# Patient Record
Sex: Male | Born: 1965 | Race: Asian | Hispanic: No | Marital: Married | Smoking: Never smoker
Health system: Southern US, Community
[De-identification: ages and names within clinical notes are randomized; demographics above are authoritative.]

## PROBLEM LIST (undated history)

## (undated) DIAGNOSIS — K219 Gastro-esophageal reflux disease without esophagitis: Secondary | ICD-10-CM

## (undated) HISTORY — PX: NO PAST SURGERIES: SHX2092

---

## 2003-03-24 ENCOUNTER — Emergency Department (HOSPITAL_COMMUNITY): Admission: EM | Admit: 2003-03-24 | Discharge: 2003-03-24 | Payer: Self-pay | Admitting: *Deleted

## 2016-03-11 ENCOUNTER — Encounter (HOSPITAL_BASED_OUTPATIENT_CLINIC_OR_DEPARTMENT_OTHER): Payer: Self-pay | Admitting: Emergency Medicine

## 2016-03-11 ENCOUNTER — Emergency Department (HOSPITAL_BASED_OUTPATIENT_CLINIC_OR_DEPARTMENT_OTHER)
Admission: EM | Admit: 2016-03-11 | Discharge: 2016-03-11 | Disposition: A | Discharge status: Home / Self Care | Payer: BLUE CROSS/BLUE SHIELD | Attending: Emergency Medicine | Admitting: Emergency Medicine

## 2016-03-11 DIAGNOSIS — I10 Essential (primary) hypertension: Secondary | ICD-10-CM

## 2016-03-11 DIAGNOSIS — K2900 Acute gastritis without bleeding: Secondary | ICD-10-CM | POA: Insufficient documentation

## 2016-03-11 DIAGNOSIS — R1013 Epigastric pain: Secondary | ICD-10-CM | POA: Diagnosis present

## 2016-03-11 HISTORY — DX: Gastro-esophageal reflux disease without esophagitis: K21.9

## 2016-03-11 LAB — CBC WITH DIFFERENTIAL/PLATELET
BASOS ABS: 0 10*3/uL (ref 0.0–0.1)
BASOS PCT: 1 %
EOS ABS: 0.2 10*3/uL (ref 0.0–0.7)
EOS PCT: 2 %
HCT: 39.6 % (ref 39.0–52.0)
HEMOGLOBIN: 14.3 g/dL (ref 13.0–17.0)
LYMPHS ABS: 2.4 10*3/uL (ref 0.7–4.0)
Lymphocytes Relative: 33 %
MCH: 32.8 pg (ref 26.0–34.0)
MCHC: 36.1 g/dL — ABNORMAL HIGH (ref 30.0–36.0)
MCV: 90.8 fL (ref 78.0–100.0)
Monocytes Absolute: 0.5 10*3/uL (ref 0.1–1.0)
Monocytes Relative: 7 %
NEUTROS PCT: 57 %
Neutro Abs: 4.1 10*3/uL (ref 1.7–7.7)
PLATELETS: 174 10*3/uL (ref 150–400)
RBC: 4.36 MIL/uL (ref 4.22–5.81)
RDW: 12.8 % (ref 11.5–15.5)
WBC: 7.2 10*3/uL (ref 4.0–10.5)

## 2016-03-11 LAB — COMPREHENSIVE METABOLIC PANEL
ALBUMIN: 4.3 g/dL (ref 3.5–5.0)
ALK PHOS: 67 U/L (ref 38–126)
ALT: 57 U/L (ref 17–63)
AST: 45 U/L — AB (ref 15–41)
Anion gap: 10 (ref 5–15)
BUN: 12 mg/dL (ref 6–20)
CALCIUM: 9 mg/dL (ref 8.9–10.3)
CHLORIDE: 103 mmol/L (ref 101–111)
CO2: 24 mmol/L (ref 22–32)
CREATININE: 0.9 mg/dL (ref 0.61–1.24)
GFR calc Af Amer: >60 mL/min (ref >60)
GFR calc non Af Amer: >60 mL/min (ref >60)
GLUCOSE: 108 mg/dL — AB (ref 65–99)
Potassium: 3.2 mmol/L — ABNORMAL LOW (ref 3.5–5.1)
SODIUM: 137 mmol/L (ref 135–145)
Total Bilirubin: 0.9 mg/dL (ref 0.3–1.2)
Total Protein: 7 g/dL (ref 6.5–8.1)

## 2016-03-11 LAB — TROPONIN I

## 2016-03-11 LAB — LIPASE, BLOOD: Lipase: 28 U/L (ref 11–51)

## 2016-03-11 MED ORDER — FAMOTIDINE 20 MG PO TABS: 20.0000 mg | ORAL_TABLET | Freq: Two times a day (BID) | ORAL | 1 refills | Status: DC

## 2016-03-11 NOTE — Discharge Instructions (Signed)
Workup here today without any acute findings. Would recommend following up with your doctor. If symptoms recur take the Pepcid as directed twice a day. Return for any new or worse symptoms.  For the high blood pressure follow-up with your doctor next week for recheck. May require treatment for high blood pressure. But difficult to make assessment based on today's blood pressure readings.

## 2016-03-11 NOTE — ED Triage Notes (Signed)
Patient reports that he felt some "acidity" to his left stomach area. He then felt feverish and hot. The patient reports that this feeling is now gone.

## 2016-03-11 NOTE — ED Provider Notes (Signed)
[facility] Note   CSN: [id number] Arrival date & time: 03/11/16  1941  By signing my name below, I, Majel HomerPeyton Lee, attest that this documentation has been prepared under the direction and in the presence of Vanetta MuldersScott Coen Miyasato, MD . Electronically Signed: Majel HomerPeyton Lee, Scribe. 03/11/2016. 8:36 PM.  History   Chief Complaint Chief Complaint  Patient presents with  . Abdominal Pain   The history is provided by the patient and the spouse. No language interpreter was used.   HPI Comments: Shawn Rowland is a 50 y.o. male with PMHx of GERD, who presents to the Emergency Department s/p an episode of sudden onset, dizziness, diaphoresis and nausea that began at 7:15 PM this evening. Per wife, pt was "sitting in his study" at the onset of his symptoms. Pt reports associated epigastric abdominal "pressure" but denies any abdominal pain. He states his symptoms "peaked" at 7:30 PM and resolved at ~7:45 PM this evening; he denies any similar symptoms now in the ED. Pt's wife notes he ate pizza last night which is an abnormal meal for him as he tries to stay away from "fatty foods;" she believes this attributed to his symptoms. Pt reports he is not currently taking any medication for his GERD. He denies vomiting and chest pain.   Past Medical History:  Diagnosis Date  . GERD (gastroesophageal reflux disease)    There are no active problems to display for this patient.  History reviewed. No pertinent surgical history.  Home Medications    Prior to Admission medications   Medication Sig Start Date End Date Taking? Authorizing Provider  famotidine (PEPCID) 20 MG tablet Take 1 tablet (20 mg total) by mouth 2 (two) times daily. 03/11/16   Vanetta MuldersScott Parish Augustine, MD    Family History History reviewed. No pertinent family history.  Social History Social History  Substance Use Topics  . Smoking status: Never Smoker  . Smokeless tobacco: Never Used  . Alcohol use Yes     Comment: weekly    Allergies   Patient has no known allergies.  Review of Systems Review of Systems  Constitutional: Positive for diaphoresis. Negative for fever.  HENT: Negative for rhinorrhea and sore throat.   Eyes: Negative for visual disturbance.  Respiratory: Negative for cough.   Cardiovascular: Negative for chest pain and leg swelling.  Gastrointestinal: Positive for abdominal pain (abdominal pressure) and nausea. Negative for diarrhea and vomiting.  Genitourinary: Negative for dysuria.  Musculoskeletal: Negative for back pain.  Skin: Negative for rash.  Neurological: Positive for dizziness. Negative for headaches.  Hematological: Does not bruise/bleed easily.   Physical Exam Updated Vital Signs BP (!) 149/118   Pulse 86   Temp 98.9 F (37.2 C) (Oral)   Resp 16   Ht 5\' 7"  (1.702 m)   Wt 148 lb 4.8 oz (67.3 kg)   SpO2 96%   BMI 23.23 kg/m   Physical Exam  Constitutional: He is oriented to person, place, and time.  HENT:  Head: Normocephalic and atraumatic.  Mouth/Throat: Oropharynx is clear and moist.  Moist mucous membranes   Eyes: EOM are normal. Pupils are equal, round, and reactive to light. No scleral icterus.  Eyes are tracking normally  Cardiovascular: Normal rate, regular rhythm and normal heart sounds.   Pulmonary/Chest: Effort normal and breath sounds normal.  Lungs clear to auscultation bilaterally   Abdominal: Bowel sounds are normal. There is no tenderness.  Musculoskeletal: He exhibits no edema, tenderness or deformity.  No swelling in ankles  Neurological: He is alert and oriented to person, place, and time. Coordination normal.  Skin: No rash noted. No erythema. No pallor.   ED Treatments / Results  Labs (all labs ordered are listed, but only abnormal results are displayed) Labs Reviewed  CBC WITH DIFFERENTIAL/PLATELET - Abnormal; Notable for the following:       Result Value   MCHC 36.1 (*)    All other components within normal limits  COMPREHENSIVE  METABOLIC PANEL - Abnormal; Notable for the following:    Potassium 3.2 (*)    Glucose, Bld 108 (*)    AST 45 (*)    All other components within normal limits  TROPONIN I  LIPASE, BLOOD   Results for orders placed or performed during the hospital encounter of 03/11/16  Troponin I  Result Value Ref Range   Troponin I <0.03 <0.03 ng/mL  CBC with Differential/Platelet  Result Value Ref Range   WBC 7.2 4.0 - 10.5 K/uL   RBC 4.36 4.22 - 5.81 MIL/uL   Hemoglobin 14.3 13.0 - 17.0 g/dL   HCT 96.0 45.4 - 09.8 %   MCV 90.8 78.0 - 100.0 fL   MCH 32.8 26.0 - 34.0 pg   MCHC 36.1 (H) 30.0 - 36.0 g/dL   RDW 11.9 14.7 - 82.9 %   Platelets 174 150 - 400 K/uL   Neutrophils Relative % 57 %   Neutro Abs 4.1 1.7 - 7.7 K/uL   Lymphocytes Relative 33 %   Lymphs Abs 2.4 0.7 - 4.0 K/uL   Monocytes Relative 7 %   Monocytes Absolute 0.5 0.1 - 1.0 K/uL   Eosinophils Relative 2 %   Eosinophils Absolute 0.2 0.0 - 0.7 K/uL   Basophils Relative 1 %   Basophils Absolute 0.0 0.0 - 0.1 K/uL  Comprehensive metabolic panel  Result Value Ref Range   Sodium 137 135 - 145 mmol/L   Potassium 3.2 (L) 3.5 - 5.1 mmol/L   Chloride 103 101 - 111 mmol/L   CO2 24 22 - 32 mmol/L   Glucose, Bld 108 (H) 65 - 99 mg/dL   BUN 12 6 - 20 mg/dL   Creatinine, Ser 5.62 0.61 - 1.24 mg/dL   Calcium 9.0 8.9 - 13.0 mg/dL   Total Protein 7.0 6.5 - 8.1 g/dL   Albumin 4.3 3.5 - 5.0 g/dL   AST 45 (H) 15 - 41 U/L   ALT 57 17 - 63 U/L   Alkaline Phosphatase 67 38 - 126 U/L   Total Bilirubin 0.9 0.3 - 1.2 mg/dL   GFR calc non Af Amer >60 >60 mL/min   GFR calc Af Amer >60 >60 mL/min   Anion gap 10 5 - 15  Lipase, blood  Result Value Ref Range   Lipase 28 11 - 51 U/L     EKG  EKG Interpretation  Date/Time:  Thursday March 11 2016 19:52:10 EST Ventricular Rate:  92 PR Interval:  148 QRS Duration: 86 QT Interval:  362 QTC Calculation: 447 R Axis:   21 Text Interpretation:  Unusual P axis, possible ectopic atrial rhythm  Nonspecific T wave abnormality Abnormal ECG No previous ECGs available Confirmed by Juanluis Guastella  MD, Loveah Like ([id number]) on 03/11/2016 8:19:07 PM       Radiology No results found.  Procedures Procedures (including critical care time)  Medications Ordered in ED Medications - No data to display  DIAGNOSTIC STUDIES:  Oxygen Saturation is 96% on RA, normal by my interpretation.    COORDINATION OF CARE:  8:34 PM Discussed treatment plan with pt at bedside and pt agreed to plan.  Initial Impression / Assessment and Plan / ED Course  I have reviewed the triage vital signs and the nursing notes.  Pertinent labs & imaging results that were available during my care of the patient were reviewed by me and considered in my medical decision making (see chart for details).  Clinical Course     Patient complaint of some epigastric abdominal pain associated with nausea no actual vomiting. No true chest pain symptoms lasted for about 30 minutes. Then resolve spontaneously. Workup here without any acute findings. Do not feel that this was an acute cardiac event. Patient has a history of reflux disease. Patient was hypertensive and has remained hypertensive here. But some improvement in the blood pressure. Will require close follow-up to determine whether he needs treatment for high blood pressure. He has been record Dr. to follow-up with. For the symptoms that brought him in patient will have a trial of Pepcid. He will return for any new or worse symptoms.  I personally performed the services described in this documentation, which was scribed in my presence. The recorded information has been reviewed and is accurate.   Final Clinical Impressions(s) / ED Diagnoses   Final diagnoses:  Acute gastritis, presence of bleeding unspecified, unspecified gastritis type  Essential hypertension    New Prescriptions New Prescriptions   FAMOTIDINE (PEPCID) 20 MG TABLET    Take 1 tablet (20 mg total) by mouth 2  (two) times daily.     Vanetta MuldersScott Emiel Kielty, MD 03/11/16 [id number]

## 2016-03-11 NOTE — ED Notes (Addendum)
Pt alert, NAD, calm, interactive, resps e/u, no dyspnea noted, reports abd discomfort and nausea (sx resolved), last BM this am (normal), last ate 1300, had tea this afternoon, sx resolved and felt better after belching PTA, "does not feel normal though, felt normal yesterday", reports feeling a little chilled (blanket given, afebrile), BP noted to be significantly high. (denies: fever, nvd, constipation, back or CP, urinary sx or bleeding. Also denies HA, blurry vision or dizziness.

## 2016-04-07 ENCOUNTER — Other Ambulatory Visit (INDEPENDENT_AMBULATORY_CARE_PROVIDER_SITE_OTHER): Payer: Self-pay | Admitting: Orthopaedic Surgery

## 2016-04-08 NOTE — Telephone Encounter (Signed)
Please advise 

## 2016-12-13 ENCOUNTER — Ambulatory Visit (INDEPENDENT_AMBULATORY_CARE_PROVIDER_SITE_OTHER): Payer: Self-pay | Admitting: Physician Assistant

## 2016-12-20 ENCOUNTER — Telehealth (INDEPENDENT_AMBULATORY_CARE_PROVIDER_SITE_OTHER): Payer: Self-pay | Admitting: Radiology

## 2016-12-20 ENCOUNTER — Ambulatory Visit (INDEPENDENT_AMBULATORY_CARE_PROVIDER_SITE_OTHER): Payer: PRIVATE HEALTH INSURANCE | Admitting: Physician Assistant

## 2016-12-20 ENCOUNTER — Encounter (INDEPENDENT_AMBULATORY_CARE_PROVIDER_SITE_OTHER): Payer: Self-pay | Admitting: Physician Assistant

## 2016-12-20 VITALS — Ht 66.0 in | Wt 150.0 lb

## 2016-12-20 DIAGNOSIS — M1A09X1 Idiopathic chronic gout, multiple sites, with tophus (tophi): Secondary | ICD-10-CM | POA: Diagnosis not present

## 2016-12-20 NOTE — Progress Notes (Signed)
Office Visit Note   Patient: Shawn Rowland           Date of Birth: 08-30-1965           MRN: [id number] Visit Date: 12/20/2016              Requested by: No referring provider defined for this encounter. PCP: Patient, No Pcp Per   Assessment & Plan: Visit Diagnoses:  1. Idiopathic chronic gout of multiple sites with tophus     Plan: Spoke with Shawn Rowland without as needed for her primary care physician. We will make a referral for Shawn Rowland for primary care physician. Information on caregivers at [facility] was given. We did draw some labs today and will call Shawn Rowland with the results of theBMET and uric acid results. He'll follow up with [place] on an as-needed basis. We feel that he would benefit from being on a preventative medication for gout.  Follow-Up Instructions: Return if symptoms worsen or fail to improve.   Orders:  Orders Placed This Encounter  Procedures  . Basic Metabolic Panel (BMET)  . Uric acid   No orders of the defined types were placed in this encounter.     Procedures: No procedures performed   Clinical Data: No additional findings.   Subjective: Chief Complaint  Patient presents with  . Right Elbow - Pain    HPI Shawn Rowland comes in today due to a history of gout and gouty flares in the past. He reports currently is having no pain from any of his joints. He's had on and off gout episodes for at least 3 years. He does have some indomethacin and colchicine which she takes periodically. He does not have a primary care physician. He is wanting to know if his uric as levels high the day and also what the status of his kidneys are. He denies any dysuria increased urgency or frequency with urination. Fact he is having that no problems. Last flare involved his right elbow and currently is having no pain in the elbow. He does not smoke or chew tobacco. Does drink on weekends.   Review of Systems Denies any chest pain, shortness of breath, fevers,  chills, dysuria, nocturia , increase in frequency or urgency of urination, increased thirst, joint pain.  Objective: Vital Signs: Ht 5\' 6"  (1.676 m)   Wt 150 lb (68 kg)   BMI 24.21 kg/m   Physical Exam  Constitutional: He is oriented to person, place, and time. He appears well-developed and well-nourished. No distress.  Pulmonary/Chest: Effort normal.  Neurological: He is alert and oriented to person, place, and time.  Skin: He is not diaphoretic.  Psychiatric: He has a normal mood and affect.    Ortho Exam Bilateral elbows he has full range of motion. No abnormal warmth erythema or tenderness with palpation ankle he has large gouty tophi over the lateral malleolus otherwise good range of motion ankle. He's nontender over the left ankle.. Bilateral there are no rashes, skin lesions, ulcerations or  tophi. He's nontender over the first MTP joints bilaterally. Dorsal pedal pulses are present bilaterally. Specialty Comments:  No specialty comments available.  Imaging: No results found.   PMFS History: There are no active problems to display for this patient.  Past Medical History:  Diagnosis Date  . GERD (gastroesophageal reflux disease)     No family history on file.  No past surgical history on file. Social History   Occupational History  .  Not on file.   Social History Main Topics  . Smoking status: Never Smoker  . Smokeless tobacco: Never Used  . Alcohol use Yes     Comment: weekly  . Drug use: No  . Sexual activity: Not on file

## 2016-12-20 NOTE — Telephone Encounter (Signed)
Shawn Rowland would like patient to be referred to Primary Care Physician. I could not locate how to enter this in the system. We are referring to [facility]. Thanks.

## 2016-12-21 ENCOUNTER — Telehealth (INDEPENDENT_AMBULATORY_CARE_PROVIDER_SITE_OTHER): Payer: Self-pay | Admitting: Radiology

## 2016-12-21 ENCOUNTER — Telehealth (INDEPENDENT_AMBULATORY_CARE_PROVIDER_SITE_OTHER): Payer: Self-pay | Admitting: Orthopaedic Surgery

## 2016-12-21 LAB — BASIC METABOLIC PANEL
BUN: 11 mg/dL (ref 7–25)
CALCIUM: 8.8 mg/dL (ref 8.6–10.3)
CO2: 22 mmol/L (ref 20–32)
CREATININE: 1.15 mg/dL (ref 0.60–1.35)
Chloride: 105 mmol/L (ref 98–110)
GLUCOSE: 93 mg/dL (ref 65–99)
Potassium: 5 mmol/L (ref 3.5–5.3)
Sodium: 145 mmol/L (ref 135–146)

## 2016-12-21 LAB — URIC ACID: Uric Acid, Serum: 11.7 mg/dL — ABNORMAL HIGH (ref 4.0–8.0)

## 2016-12-21 MED ORDER — COLCHICINE 0.6 MG PO TABS: 0.6000 mg | ORAL_TABLET | Freq: Every day | ORAL | 0 refills | Status: DC

## 2016-12-21 NOTE — Telephone Encounter (Signed)
Patient returned call asked for a call back. The number to contact patient is  [phone]

## 2016-12-21 NOTE — Telephone Encounter (Signed)
I left voicemail for patient advising per Shawn Rowland, uric acid is elevated. Kidney functions are okay.  Sent in Colchicine 0.6mg  to pharmacy, one daily, and advised patient that he needs to make appt with PCP.

## 2016-12-22 MED ORDER — INDOMETHACIN 50 MG PO CAPS: 50.0000 mg | ORAL_CAPSULE | Freq: Two times a day (BID) | ORAL | 0 refills | Status: DC

## 2016-12-22 NOTE — Telephone Encounter (Signed)
Sent to pharmacy. I called patient and advised. 

## 2016-12-22 NOTE — Addendum Note (Signed)
Addended by: Rogers Seeds on: 12/22/2016 04:52 PM   Modules accepted: Orders

## 2016-12-22 NOTE — Telephone Encounter (Signed)
Okay to fill the indomethacin

## 2016-12-22 NOTE — Telephone Encounter (Signed)
I called patient. He requests his indomethacin be refilled as well. Please advise.

## 2016-12-23 NOTE — Addendum Note (Signed)
Addended by: Rogers SeedsYEATTS, Shirl Ludington M on: 12/23/2016 01:38 PM   Modules accepted: Orders

## 2016-12-23 NOTE — Telephone Encounter (Signed)
I found it, it is ambulatory referral to family practice.  It is not saved in our favorites though.

## 2016-12-23 NOTE — Telephone Encounter (Signed)
Referral entered  

## 2017-01-06 ENCOUNTER — Telehealth (INDEPENDENT_AMBULATORY_CARE_PROVIDER_SITE_OTHER): Payer: Self-pay

## 2017-01-06 NOTE — Telephone Encounter (Signed)
Patient called stating that Rx for allopurinol  Was not sent to his pharmacy. Rx for Indocin is not available, but the pharmacy has 2 and would like to know if this will be okay.  Cb# [phone].  Please advise.

## 2017-01-10 NOTE — Telephone Encounter (Signed)
We spoke about this

## 2017-01-10 NOTE — Telephone Encounter (Signed)
See below, please advise.

## 2017-01-11 NOTE — Telephone Encounter (Signed)
LMOM for patient letting him know Shawn Rowland states it would probably be easier for him to get this from his PCP

## 2017-01-31 ENCOUNTER — Encounter: Payer: Self-pay | Admitting: Family Medicine

## 2017-01-31 ENCOUNTER — Ambulatory Visit (INDEPENDENT_AMBULATORY_CARE_PROVIDER_SITE_OTHER): Payer: PRIVATE HEALTH INSURANCE | Admitting: Family Medicine

## 2017-01-31 VITALS — BP 104/64 | HR 100 | Temp 98.3°F | Ht 67.0 in | Wt 151.1 lb

## 2017-01-31 DIAGNOSIS — M1A9XX0 Chronic gout, unspecified, without tophus (tophi): Secondary | ICD-10-CM

## 2017-01-31 MED ORDER — INDOMETHACIN 50 MG PO CAPS: 50.0000 mg | ORAL_CAPSULE | Freq: Two times a day (BID) | ORAL | 0 refills | Status: DC

## 2017-01-31 MED ORDER — ALLOPURINOL 300 MG PO TABS: 150.0000 mg | ORAL_TABLET | Freq: Every day | ORAL | 2 refills | Status: DC

## 2017-01-31 MED ORDER — COLCHICINE 0.6 MG PO TABS: ORAL_TABLET | ORAL | 0 refills | Status: DC

## 2017-01-31 NOTE — Progress Notes (Signed)
Chief Complaint  Patient presents with  . Establish Care       New Patient Visit SUBJECTIVE: HPI: Shawn Rowland is an 51 y.o.male who is being seen for establishing care.  The patient has not had a routine PCP.  Hx of gout affecting R 1st MTP. Recent urate checked in 11/2016 11.7. Uses colchicine and indomethacin for flares, not on prophylaxis. He is currently having a flare of his R 1st MTP. 2 years ago affected L ankle. Dx'd in 2006, father also had hx of gout. No hx of renal failure.   No Known Allergies  Past Medical History:  Diagnosis Date  . GERD (gastroesophageal reflux disease)    Past Surgical History:  Procedure Laterality Date  . NO PAST SURGERIES     Social History   Social History  . Marital status: Married   Social History Main Topics  . Smoking status: Never Smoker  . Smokeless tobacco: Never Used  . Alcohol use Yes     Comment: weekly  . Drug use: No  . Sexual activity: Yes    Partners: Female   Family History  Problem Relation Age of Onset  . Gout Father   . Cancer Neg Hx     Current Outpatient Prescriptions:  .  colchicine 0.6 MG tablet, 1.2 mg by mouth at beginning of flare, 0.6 mg 1 hr later then 0.6 mg daily until flare resolves, Disp: 30 tablet, Rfl: 0 .  indomethacin (INDOCIN) 50 MG capsule, Take 1 capsule (50 mg total) by mouth 2 (two) times daily with a meal., Disp: 60 capsule, Rfl: 0 .  allopurinol (ZYLOPRIM) 300 MG tablet, Take 0.5 tablets (150 mg total) by mouth daily., Disp: 30 tablet, Rfl: 2  ROS Skin: Denies redness  Msk: +1st MTP pain   OBJECTIVE: BP 104/64 (BP Location: Left Arm, Patient Position: Sitting, Cuff Size: Normal)   Pulse 100   Temp 98.3 F (36.8 C) (Oral)   Ht  (1.702 m)   Wt 151 lb 2 oz (68.5 kg)   SpO2 97%   BMI 23.67 kg/m   Constitutional: -  VS reviewed -  Well developed, well nourished, appears stated age -  No apparent distress  Psychiatric: -  Oriented to person, place, and time -  Memory  intact -  Affect and mood normal -  Fluent conversation, good eye contact -  Judgment and insight age appropriate  ENMT: -  MMM    Pharynx moist, no exudate, no erythema  Neck: -  No gross swelling, no palpable masses -  Thyroid midline, not enlarged, mobile, no palpable masses  Cardiovascular: -  RRR -  No LE edema  Respiratory: -  Normal respiratory effort, no accessory muscle use, no retraction -  Breath sounds equal, no wheezes, no ronchi, no crackles  Gastrointestinal: -  Bowel sounds normal -  No tenderness, no distention, no guarding, no masses  Musculoskeletal: -  +L lat mall tophus -  +TTP over 1st MTP on R, mild swelling no erythema, +warmth  Skin: -  No significant lesion on inspection -  Warm and dry to palpation   ASSESSMENT/PLAN: Chronic gout involving toe of right foot without tophus, unspecified cause - Plan: indomethacin (INDOCIN) 50 MG capsule, colchicine 0.6 MG tablet, allopurinol (ZYLOPRIM) 300 MG tablet  Patient instructed to sign release of records form from his previous PCP. Will start with 150 mg daily of allopurinol, only use colchicine 1.2 mg at onset of flare, repeat in 1 hour,  then take daily until resolution of flare. Indomethacin called in should he be travelling. I do not want him taking both anymore for now. Dietary info for gout given to patient.  Patient should return in 4 weeks to recheck gout. Ck urate level and renal function. The patient voiced understanding and agreement to the plan.   Jilda Roche Alba, DO 01/31/17  2:03 PM

## 2017-01-31 NOTE — Progress Notes (Signed)
Pre visit review using our clinic review tool, if applicable. No additional management support is needed unless otherwise documented below in the visit note. 

## 2017-01-31 NOTE — Patient Instructions (Signed)
Take 1/2 tab daily of new medicine daily until I see you next.  You do not need to be fasting for your next appointment.  When you have a flare, use the colchicine. Indomethacin if that is not helpful or if you are out of town.   Follow the instructions on the diet handout.   Let us know if you need anything.

## 2017-02-28 ENCOUNTER — Encounter: Payer: Self-pay | Admitting: Family Medicine

## 2017-02-28 ENCOUNTER — Ambulatory Visit (INDEPENDENT_AMBULATORY_CARE_PROVIDER_SITE_OTHER): Payer: PRIVATE HEALTH INSURANCE | Admitting: Family Medicine

## 2017-02-28 VITALS — BP 120/72 | HR 86 | Temp 98.6°F | Ht 67.0 in | Wt 150.2 lb

## 2017-02-28 DIAGNOSIS — M1A9XX0 Chronic gout, unspecified, without tophus (tophi): Secondary | ICD-10-CM | POA: Diagnosis not present

## 2017-02-28 LAB — COMPREHENSIVE METABOLIC PANEL
ALK PHOS: 117 U/L (ref 39–117)
ALT: 43 U/L (ref 0–53)
AST: 25 U/L (ref 0–37)
Albumin: 4.1 g/dL (ref 3.5–5.2)
BUN: 7 mg/dL (ref 6–23)
CHLORIDE: 100 meq/L (ref 96–112)
CO2: 26 mEq/L (ref 19–32)
CREATININE: 0.96 mg/dL (ref 0.40–1.50)
Calcium: 9.1 mg/dL (ref 8.4–10.5)
GFR: 88.36 mL/min (ref >60.00)
Glucose, Bld: 106 mg/dL — ABNORMAL HIGH (ref 70–99)
Potassium: 4.2 mEq/L (ref 3.5–5.1)
SODIUM: 134 meq/L — AB (ref 135–145)
Total Bilirubin: 0.5 mg/dL (ref 0.2–1.2)
Total Protein: 7.3 g/dL (ref 6.0–8.3)

## 2017-02-28 LAB — URIC ACID: URIC ACID, SERUM: 6.5 mg/dL (ref 4.0–7.8)

## 2017-02-28 NOTE — Patient Instructions (Signed)
Give us 2-3 business days to get the results of your labs back. If labs are normal, you will likely receive a letter in the mail unless you have MyChart. This can take longer than 2-3 business days.   Let us know if you need anything.  

## 2017-02-28 NOTE — Progress Notes (Signed)
Chief Complaint  Patient presents with  . Follow-up    Shawn Rowland is a 51 y.o. male here for gout.  Currently being treated with Allopurinol 150 mg daily. The joint(s) affected include: R wrist and MTP Most recent uric acid level is: 11.7 on 12/20/16 Reports compliance. Side effects of medications: None Is avoiding seafood, sweet/sugary beverages, alcohol, and red meats.  ROS:  MSK: No joint pain  Past Medical History:  Diagnosis Date  . GERD (gastroesophageal reflux disease)       BP 120/72 (BP Location: Left Arm, Patient Position: Sitting, Cuff Size: Normal)   Pulse 86   Temp 98.6 F (37 C) (Oral)   Ht 5\' 7"  (1.702 m)   Wt 150 lb 4 oz (68.2 kg)   SpO2 99%   BMI 23.53 kg/m  Gen: Awake, alert, appears stated age Lungs: no accessory muscle use MSK: no swelling or TTP, no excessive warmth or erythema Psych: Age appropriate judgment and insight, nml mood and affect  Chronic gout involving toe of right foot without tophus, unspecified cause - Plan: Comprehensive metabolic panel, Uric acid  Orders as above. Cont current dose of Allopurinol for now, may adjust pending results of above.  He also has a hx of elevated liver enzyme in 2017, will check up on this.  Reminded to avoid foods like alcohol, sweet beverages, red meat, lunch meat, sea food. F/u in 3 mo. The patient voiced understanding and agreement to the plan.  Jilda Rocheicholas Paul CastrovilleWendling, DO 02/28/17 1:46 PM

## 2017-02-28 NOTE — Progress Notes (Signed)
Pre visit review using our clinic review tool, if applicable. No additional management support is needed unless otherwise documented below in the visit note. 

## 2017-03-01 ENCOUNTER — Other Ambulatory Visit: Payer: Self-pay | Admitting: Family Medicine

## 2017-03-01 DIAGNOSIS — M1A9XX0 Chronic gout, unspecified, without tophus (tophi): Secondary | ICD-10-CM

## 2017-03-16 ENCOUNTER — Other Ambulatory Visit: Payer: Self-pay | Admitting: Family Medicine

## 2017-03-16 DIAGNOSIS — M1A9XX0 Chronic gout, unspecified, without tophus (tophi): Secondary | ICD-10-CM

## 2017-03-24 ENCOUNTER — Telehealth: Payer: Self-pay | Admitting: Family Medicine

## 2017-03-24 NOTE — Telephone Encounter (Signed)
Copied from CRM [id number]. Topic: Quick Communication - See Telephone Encounter >> Mar 24, 2017  2:17 PM Valentina LucksMatos, Jackelin wrote: CRM for notification. See Telephone encounter for:  03/24/17.   Pt dropped off document to be filled out by provider (Document from [employer] with the letter that provider printed out for pt - 3 pages) Pt is wanting to be called at tel [phone] (Wifes tel number Janett BillowNita) when document ready. Document put at front office tray.

## 2017-03-29 NOTE — Telephone Encounter (Signed)
Patient completed most of paperwork; forwarded to provider with notation/SLS

## 2017-03-30 ENCOUNTER — Other Ambulatory Visit: Payer: Self-pay | Admitting: Family Medicine

## 2017-03-30 DIAGNOSIS — M1A9XX0 Chronic gout, unspecified, without tophus (tophi): Secondary | ICD-10-CM

## 2017-03-31 NOTE — Telephone Encounter (Signed)
Received form back from provider needing travel disability dates; [facility] with contact name and number for return call RE: dates needed per provider instructions/SLS 12/06

## 2017-04-04 NOTE — Telephone Encounter (Signed)
Pt. Wife calling back with Date.  November 14th-28th.

## 2017-04-06 NOTE — Telephone Encounter (Signed)
Called the patient and spoke to wife to get travel dates disabled to put dates on form. She stated was 03/09/2017 through 03/23/2017 Copied form/put on  Sharon's desk for her records to file. The wife will pickup completed form at the front desk.

## 2017-04-26 ENCOUNTER — Other Ambulatory Visit: Payer: Self-pay | Admitting: Family Medicine

## 2017-04-26 DIAGNOSIS — M1A9XX0 Chronic gout, unspecified, without tophus (tophi): Secondary | ICD-10-CM

## 2017-05-03 ENCOUNTER — Other Ambulatory Visit: Payer: Self-pay | Admitting: Family Medicine

## 2017-05-03 DIAGNOSIS — M1A9XX0 Chronic gout, unspecified, without tophus (tophi): Secondary | ICD-10-CM

## 2017-05-03 NOTE — Telephone Encounter (Signed)
30 day supply gabapentin sent to pharmacy. Pt last seen 02/28/17 and advised to follow up 05/31/17 but pt has not scheduled appt. Please call pt to schedule appt with Dr Carmelia RollerWendling. Thanks!

## 2017-05-03 NOTE — Telephone Encounter (Signed)
Called pt to advise him to schedule an appt. Voicemail was not set up therefore I could not leave a message.

## 2017-05-29 ENCOUNTER — Other Ambulatory Visit: Payer: Self-pay | Admitting: Family Medicine

## 2017-05-29 DIAGNOSIS — M1A9XX0 Chronic gout, unspecified, without tophus (tophi): Secondary | ICD-10-CM

## 2017-07-09 ENCOUNTER — Other Ambulatory Visit: Payer: Self-pay | Admitting: Family Medicine

## 2017-07-09 DIAGNOSIS — M1A9XX0 Chronic gout, unspecified, without tophus (tophi): Secondary | ICD-10-CM

## 2017-10-15 ENCOUNTER — Other Ambulatory Visit: Payer: Self-pay | Admitting: Family Medicine

## 2017-10-15 DIAGNOSIS — M1A9XX0 Chronic gout, unspecified, without tophus (tophi): Secondary | ICD-10-CM

## 2018-01-20 ENCOUNTER — Encounter: Payer: Self-pay | Admitting: Family Medicine

## 2018-01-20 ENCOUNTER — Ambulatory Visit (INDEPENDENT_AMBULATORY_CARE_PROVIDER_SITE_OTHER): Payer: PRIVATE HEALTH INSURANCE | Admitting: Family Medicine

## 2018-01-20 VITALS — BP 120/80 | HR 93 | Temp 98.4°F | Resp 16 | Ht 67.0 in | Wt 143.0 lb

## 2018-01-20 DIAGNOSIS — M1A9XX0 Chronic gout, unspecified, without tophus (tophi): Secondary | ICD-10-CM | POA: Insufficient documentation

## 2018-01-20 DIAGNOSIS — Z2821 Immunization not carried out because of patient refusal: Secondary | ICD-10-CM | POA: Insufficient documentation

## 2018-01-20 DIAGNOSIS — Z1211 Encounter for screening for malignant neoplasm of colon: Secondary | ICD-10-CM | POA: Diagnosis not present

## 2018-01-20 DIAGNOSIS — Z23 Encounter for immunization: Secondary | ICD-10-CM | POA: Diagnosis not present

## 2018-01-20 DIAGNOSIS — Z114 Encounter for screening for human immunodeficiency virus [HIV]: Secondary | ICD-10-CM | POA: Diagnosis not present

## 2018-01-20 DIAGNOSIS — Z0000 Encounter for general adult medical examination without abnormal findings: Secondary | ICD-10-CM

## 2018-01-20 LAB — COMPREHENSIVE METABOLIC PANEL
ALT: 36 U/L (ref 0–53)
AST: 36 U/L (ref 0–37)
Albumin: 4.2 g/dL (ref 3.5–5.2)
Alkaline Phosphatase: 72 U/L (ref 39–117)
BUN: 10 mg/dL (ref 6–23)
CHLORIDE: 104 meq/L (ref 96–112)
CO2: 30 mEq/L (ref 19–32)
Calcium: 8.7 mg/dL (ref 8.4–10.5)
Creatinine, Ser: 1.06 mg/dL (ref 0.40–1.50)
GFR: 78.52 mL/min (ref >60.00)
Glucose, Bld: 87 mg/dL (ref 70–99)
POTASSIUM: 4.8 meq/L (ref 3.5–5.1)
SODIUM: 139 meq/L (ref 135–145)
Total Bilirubin: 0.6 mg/dL (ref 0.2–1.2)
Total Protein: 6.6 g/dL (ref 6.0–8.3)

## 2018-01-20 LAB — LDL CHOLESTEROL, DIRECT: LDL DIRECT: 82 mg/dL

## 2018-01-20 LAB — LIPID PANEL
CHOL/HDL RATIO: 3
Cholesterol: 154 mg/dL (ref 0–200)
HDL: 50.1 mg/dL (ref >39.00)
NONHDL: 103.54
Triglycerides: 215 mg/dL — ABNORMAL HIGH (ref 0.0–149.0)
VLDL: 43 mg/dL — ABNORMAL HIGH (ref 0.0–40.0)

## 2018-01-20 LAB — URIC ACID: Uric Acid, Serum: 6.1 mg/dL (ref 4.0–7.8)

## 2018-01-20 NOTE — Addendum Note (Signed)
Addended by: Steve Rattler A on: 01/20/2018 04:05 PM   Modules accepted: Orders

## 2018-01-20 NOTE — Patient Instructions (Addendum)
The new Shingrix vaccine (for shingles) is a 2 shot series. It can make people feel low energy, achy and almost like they have the flu for 48 hours after injection. Please plan accordingly when deciding on when to get this shot. Call our office for a nurse visit appointment to get this. The second shot of the series is less severe regarding the side effects, but it still lasts 48 hours.   If you do not hear anything about your referral in the next 1-2 weeks, call our office and ask for an update.  Give [place] 2-3 business days to get the results of your labs back.   Let us know if you need anything.

## 2018-01-20 NOTE — Progress Notes (Signed)
Chief Complaint  Patient presents with  . Annual Exam  . Cholelithiasis    was seen in [place], told he has gallstones, no abdominal pain    Well Male Shawn Rowland is here for a complete physical.   His last physical was >1 year ago.  Current diet: in general, a "healthy" diet.  Current exercise: lifting weights, hits a golf ball Weight trend: stable Seat belt? Yes.    Told he has gallstones, no s/s's. Questions about if he needs it removed.   Health maintenance Shingrix- No Colonoscopy- No Tetanus- No HIV- No Prostate cancer screening- No   Past Medical History:  Diagnosis Date  . GERD (gastroesophageal reflux disease)       Past Surgical History:  Procedure Laterality Date  . NO PAST SURGERIES      Medications  Current Outpatient Medications on File Prior to Visit  Medication Sig Dispense Refill  . allopurinol (ZYLOPRIM) 300 MG tablet TAKE 0.5 TABLETS (150 MG TOTAL) BY MOUTH DAILY. 30 tablet 0  . colchicine 0.6 MG tablet TAKE 2 TABS AT BEGINNING OF FLARE, 1 TAB 1 HR LATER,1 TAB DAILY UNTIL FLARE RESOLVES 30 tablet 0  . indomethacin (INDOCIN) 50 MG capsule TAKE 1 CAPSULE (50 MG TOTAL) BY MOUTH 2 (TWO) TIMES DAILY WITH A MEAL. 60 capsule 0    Allergies No Known Allergies  Family History Family History  Problem Relation Age of Onset  . Gout Father   . Cancer Neg Hx     Review of Systems: Constitutional:  no fevers Eye:  no recent significant change in vision Ear/Nose/Mouth/Throat:  Ears:  no hearing loss Nose/Mouth/Throat:  no complaints of nasal congestion, no sore throat Cardiovascular:  no chest pain, no palpitations Respiratory:  no cough and no shortness of breath Gastrointestinal:  no abdominal pain, no change in bowel habits GU:  Male: negative for dysuria, frequency, and incontinence and negative for prostate symptoms Musculoskeletal/Extremities:  no pain, redness, or swelling of the joints Integumentary (Skin/Breast):  no abnormal skin lesions  reported Neurologic:  no headaches Endocrine: No unexpected weight changes Hematologic/Lymphatic:  no abnormal bleeding  Exam BP 120/80 (BP Location: Left Arm, Patient Position: Sitting, Cuff Size: Normal)   Pulse 93   Temp 98.4 F (36.9 C) (Oral)   Resp 16   Ht 5\' 7"  (1.702 m)   Wt 143 lb (64.9 kg)   SpO2 98%   BMI 22.40 kg/m  General:  well developed, well nourished, in no apparent distress Skin:  no significant moles, warts, or growths Head:  no masses, lesions, or tenderness Eyes:  pupils equal and round, sclera anicteric without injection Ears:  canals without lesions, TMs shiny without retraction, no obvious effusion, no erythema Nose:  nares patent, septum midline, mucosa normal Throat/Pharynx:  lips and gingiva without lesion; tongue and uvula midline; non-inflamed pharynx; no exudates or postnasal drainage Neck: neck supple without adenopathy, thyromegaly, or masses Lungs:  clear to auscultation, breath sounds equal bilaterally, no respiratory distress Cardio:  regular rate and rhythm, no LE edema, no bruits Abdomen:  abdomen soft, nontender; bowel sounds normal; no masses or organomegaly Genital (male): Uncircumcised penis, no lesions or discharge; testes present bilaterally without masses or tenderness Rectal: Deferred Musculoskeletal:  symmetrical muscle groups noted without atrophy or deformity Extremities:  no clubbing, cyanosis, or edema, no deformities, no skin discoloration Neuro:  gait normal; deep tendon reflexes normal and symmetric Psych: well oriented with normal range of affect and appropriate judgment/insight  Assessment and Plan  Well adult exam - Plan: Lipid panel, Comprehensive metabolic panel  Screen for colon cancer - Plan: Ambulatory referral to Gastroenterology  Screening for HIV (human immunodeficiency virus) - Plan: HIV Antibody (routine testing w rflx)  Chronic gout involving toe of right foot without tophus, unspecified cause - Plan: Uric  acid   Well 52 y.o. male. Counseled on diet and exercise. Doing well. Tetanus today. Refuses flu shot. Counseled on risks and benefits of prostate cancer screening with PSA. The patient agrees to forego testing. Immunizations, labs, and further orders as above. Follow up in 6 mo. The patient voiced understanding and agreement to the plan.  Jilda Roche [place], DO 01/20/18 1:44 PM

## 2018-01-21 LAB — HIV ANTIBODY (ROUTINE TESTING W REFLEX): HIV 1&2 Ab, 4th Generation: NONREACTIVE

## 2018-01-24 ENCOUNTER — Other Ambulatory Visit: Payer: Self-pay | Admitting: Family Medicine

## 2018-01-24 DIAGNOSIS — M1A9XX0 Chronic gout, unspecified, without tophus (tophi): Secondary | ICD-10-CM

## 2018-03-30 ENCOUNTER — Encounter: Payer: Self-pay | Admitting: Family Medicine

## 2018-04-13 ENCOUNTER — Encounter: Payer: Self-pay | Admitting: Family Medicine

## 2018-07-19 ENCOUNTER — Other Ambulatory Visit: Payer: Self-pay | Admitting: Family Medicine

## 2018-07-19 DIAGNOSIS — M1A9XX0 Chronic gout, unspecified, without tophus (tophi): Secondary | ICD-10-CM

## 2018-12-10 ENCOUNTER — Emergency Department (HOSPITAL_BASED_OUTPATIENT_CLINIC_OR_DEPARTMENT_OTHER): Payer: No Typology Code available for payment source

## 2018-12-10 ENCOUNTER — Encounter (HOSPITAL_BASED_OUTPATIENT_CLINIC_OR_DEPARTMENT_OTHER): Payer: Self-pay | Admitting: Emergency Medicine

## 2018-12-10 ENCOUNTER — Other Ambulatory Visit: Payer: Self-pay

## 2018-12-10 ENCOUNTER — Emergency Department (HOSPITAL_BASED_OUTPATIENT_CLINIC_OR_DEPARTMENT_OTHER)
Admission: EM | Admit: 2018-12-10 | Discharge: 2018-12-10 | Disposition: A | Discharge status: Home / Self Care | Payer: No Typology Code available for payment source | Attending: Emergency Medicine | Admitting: Emergency Medicine

## 2018-12-10 DIAGNOSIS — Z79899 Other long term (current) drug therapy: Secondary | ICD-10-CM | POA: Insufficient documentation

## 2018-12-10 DIAGNOSIS — R42 Dizziness and giddiness: Secondary | ICD-10-CM | POA: Insufficient documentation

## 2018-12-10 DIAGNOSIS — R0602 Shortness of breath: Secondary | ICD-10-CM | POA: Diagnosis not present

## 2018-12-10 LAB — CBC WITH DIFFERENTIAL/PLATELET
Abs Immature Granulocytes: 0.02 10*3/uL (ref 0.00–0.07)
Basophils Absolute: 0.1 10*3/uL (ref 0.0–0.1)
Basophils Relative: 1 %
Eosinophils Absolute: 0.1 10*3/uL (ref 0.0–0.5)
Eosinophils Relative: 2 %
HCT: 42.2 % (ref 39.0–52.0)
Hemoglobin: 14.8 g/dL (ref 13.0–17.0)
Immature Granulocytes: 0 %
Lymphocytes Relative: 23 %
Lymphs Abs: 1.2 10*3/uL (ref 0.7–4.0)
MCH: 33.9 pg (ref 26.0–34.0)
MCHC: 35.1 g/dL (ref 30.0–36.0)
MCV: 96.6 fL (ref 80.0–100.0)
Monocytes Absolute: 0.3 10*3/uL (ref 0.1–1.0)
Monocytes Relative: 6 %
Neutro Abs: 3.7 10*3/uL (ref 1.7–7.7)
Neutrophils Relative %: 68 %
Platelets: 189 10*3/uL (ref 150–400)
RBC: 4.37 MIL/uL (ref 4.22–5.81)
RDW: 12.5 % (ref 11.5–15.5)
WBC: 5.4 10*3/uL (ref 4.0–10.5)
nRBC: 0 % (ref 0.0–0.2)

## 2018-12-10 LAB — BASIC METABOLIC PANEL
Anion gap: 16 — ABNORMAL HIGH (ref 5–15)
BUN: 12 mg/dL (ref 6–20)
CO2: 21 mmol/L — ABNORMAL LOW (ref 22–32)
Calcium: 9 mg/dL (ref 8.9–10.3)
Chloride: 101 mmol/L (ref 98–111)
Creatinine, Ser: 0.93 mg/dL (ref 0.61–1.24)
GFR calc Af Amer: >60 mL/min (ref >60)
GFR calc non Af Amer: >60 mL/min (ref >60)
Glucose, Bld: 105 mg/dL — ABNORMAL HIGH (ref 70–99)
Potassium: 3.6 mmol/L (ref 3.5–5.1)
Sodium: 138 mmol/L (ref 135–145)

## 2018-12-10 NOTE — ED Triage Notes (Signed)
Pt reports he had SOB x 1 hour. It has resolved. Denies pain.

## 2018-12-10 NOTE — ED Provider Notes (Signed)
[facility] EMERGENCY DEPARTMENT Provider Note   CSN: [id number] Arrival date & time: 12/10/18  1257     History   Chief Complaint Chief Complaint  Patient presents with  . Shortness of Breath    HPI Renold Rasheed is a 53 y.o. male.     The history is provided by the patient. No language interpreter was used.  Shortness of Breath    53 year old male presenting for evaluation of shortness of breath.  Patient report approximate 4 hours ago he developed a gradual onset of stomach uneasiness, mild indigestion, feeling short of breath, complains of dizziness feel like he is going to pass out, which lasted for few hours.  He states he tried to belch which subsequently makes him feel better.  At this time he felt normal.  His significant other was concerned about his symptoms and prompted him to come here.  Patient otherwise denies having fever, headache, chest pain, productive cough, vomiting, diarrhea, focal numbness or focal weakness.  He did endorse mild nausea which has since resolved.  He has not had lunch yet.  He did have some bread for breakfast this morning.  Patient states he felt at baseline at this time.  Patient admits to social drinking but denies tobacco use.  No significant cardiac history.  Denies any recent sick contact.  No prior history of PE or DVT.  Past Medical History:  Diagnosis Date  . GERD (gastroesophageal reflux disease)     Patient Active Problem List   Diagnosis Date Noted  . Chronic gout involving toe of right foot without tophus 01/20/2018  . Refused influenza vaccine 01/20/2018    Past Surgical History:  Procedure Laterality Date  . NO PAST SURGERIES          Home Medications    Prior to Admission medications   Medication Sig Start Date End Date Taking? Authorizing Provider  colchicine 0.6 MG tablet TAKE 2 TABS AT BEGINNING OF FLARE, 1 TAB 1 HR LATER,1 TAB DAILY UNTIL FLARE RESOLVES 07/19/18   Shelda Pal, DO   indomethacin (INDOCIN) 50 MG capsule TAKE 1 CAPSULE (50 MG TOTAL) BY MOUTH 2 (TWO) TIMES DAILY WITH A MEAL. 01/25/18   Shelda Pal, DO    Family History Family History  Problem Relation Age of Onset  . Gout Father   . Cancer Neg Hx     Social History Social History   Tobacco Use  . Smoking status: Never Smoker  . Smokeless tobacco: Never Used  Substance Use Topics  . Alcohol use: Yes    Comment: weekly  . Drug use: No     Allergies   Patient has no known allergies.   Review of Systems Review of Systems  Respiratory: Positive for shortness of breath.   All other systems reviewed and are negative.    Physical Exam Updated Vital Signs BP (!) 139/98 (BP Location: Left Arm)   Pulse (!) 105   Temp 99.3 F (37.4 C) (Oral)   Resp 18   Ht 5\' 7"  (1.702 m)   Wt 65.8 kg   SpO2 98%   BMI 22.71 kg/m   Physical Exam Vitals signs and nursing note reviewed.  Constitutional:      General: He is not in acute distress.    Appearance: He is well-developed.  HENT:     Head: Atraumatic.  Eyes:     Conjunctiva/sclera: Conjunctivae normal.  Neck:     Musculoskeletal: Neck supple.  Cardiovascular:  Rate and Rhythm: Normal rate and regular rhythm.     Heart sounds: No murmur. No friction rub. No gallop.   Pulmonary:     Effort: Pulmonary effort is normal. No tachypnea.     Breath sounds: Normal breath sounds. No decreased breath sounds, wheezing, rhonchi or rales.  Chest:     Chest wall: No tenderness.  Abdominal:     Palpations: Abdomen is soft.  Musculoskeletal:     Right lower leg: No edema.     Left lower leg: No edema.  Skin:    Capillary Refill: Capillary refill takes less than 2 seconds.     Findings: No rash.  Neurological:     Mental Status: He is alert and oriented to person, place, and time.  Psychiatric:        Mood and Affect: Mood normal.      ED Treatments / Results  Labs (all labs ordered are listed, but only abnormal results are  displayed) Labs Reviewed  BASIC METABOLIC PANEL - Abnormal; Notable for the following components:      Result Value   CO2 21 (*)    Glucose, Bld 105 (*)    Anion gap 16 (*)    All other components within normal limits  CBC WITH DIFFERENTIAL/PLATELET    EKG None  ED ECG REPORT   Date: 12/10/2018  Rate: 96  Rhythm: normal sinus rhythm  QRS Axis: left  Intervals: normal  ST/T Wave abnormalities: early repolarization  Conduction Disutrbances:none  Narrative Interpretation:   Old EKG Reviewed: none available  I have personally reviewed the EKG tracing and agree with the computerized printout as noted.   Radiology Dg Chest Port 1 View  Result Date: 12/10/2018 CLINICAL DATA:  Short of breath.  Former smoker. EXAM: PORTABLE CHEST 1 VIEW COMPARISON:  None. FINDINGS: The heart size and mediastinal contours are within normal limits. Both lungs are clear. The visualized skeletal structures are unremarkable. IMPRESSION: No active disease. Electronically Signed   By: Signa Kellaylor  Stroud M.D.   On: 12/10/2018 13:53    Procedures Procedures (including critical care time)  Medications Ordered in ED Medications - No data to display   Initial Impression / Assessment and Plan / ED Course  I have reviewed the triage vital signs and the nursing notes.  Pertinent labs & imaging results that were available during my care of the patient were reviewed by me and considered in my medical decision making (see chart for details).        BP (!) 157/107 (BP Location: Right Arm)   Pulse 90   Temp 99.3 F (37.4 C) (Oral)   Resp 17   Ht 5\' 7"  (1.702 m)   Wt 65.8 kg   SpO2 99%   BMI 22.71 kg/m    Final Clinical Impressions(s) / ED Diagnoses   Final diagnoses:  Shortness of breath    ED Discharge Orders    None     1:23 PM Patient reported an episode of abdominal discomfort, with sensation of dizziness, and shortness of breath lasting for a few hours which is since resolved without any  specific treatment.  At this time he does not have any symptoms.  He is mildly tachycardic.  Will perform screening exam.  2:41 PM Patient does have orthostatic vital sign which likely explain his bouts of dizziness.  He however currently symptom-free, ambulate without difficulty.  EKG shows early re-pole pattern but no concerning arrhythmia.  No ischemic changes.  Labs otherwise reassuring.  Encourage patient to stay hydrated and follow-up with PCP for further care.  Return precaution discussed.  Low suspicion for ACS.  Low suspicion for PE.   Fayrene Helperran, Sydelle Sherfield, PA-C 12/10/18 1446    [place], Adam, DO 12/10/18 1520

## 2018-12-10 NOTE — ED Notes (Signed)
Pt verbalized understanding of dc instructions.

## 2018-12-10 NOTE — ED Notes (Signed)
Pt SPO2 while ambulating stayed above 97%. Pt denies SOB while ambulating.

## 2018-12-10 NOTE — Discharge Instructions (Signed)
Stay hydrated and follow up with your doctor for further care.  Return if you have any concerns.

## 2018-12-18 ENCOUNTER — Other Ambulatory Visit: Payer: Self-pay | Admitting: Family Medicine

## 2018-12-18 DIAGNOSIS — M1A9XX0 Chronic gout, unspecified, without tophus (tophi): Secondary | ICD-10-CM

## 2019-01-01 ENCOUNTER — Other Ambulatory Visit: Payer: Self-pay | Admitting: Family Medicine

## 2019-01-01 DIAGNOSIS — M1A9XX0 Chronic gout, unspecified, without tophus (tophi): Secondary | ICD-10-CM

## 2019-01-04 ENCOUNTER — Other Ambulatory Visit: Payer: Self-pay | Admitting: Family Medicine

## 2019-01-04 DIAGNOSIS — M1A9XX0 Chronic gout, unspecified, without tophus (tophi): Secondary | ICD-10-CM

## 2019-04-10 ENCOUNTER — Other Ambulatory Visit: Payer: Self-pay | Admitting: Family Medicine

## 2019-04-10 DIAGNOSIS — M1A9XX0 Chronic gout, unspecified, without tophus (tophi): Secondary | ICD-10-CM

## 2019-05-01 ENCOUNTER — Other Ambulatory Visit: Payer: Self-pay | Admitting: Family Medicine

## 2019-05-01 DIAGNOSIS — M1A9XX0 Chronic gout, unspecified, without tophus (tophi): Secondary | ICD-10-CM

## 2019-05-05 ENCOUNTER — Other Ambulatory Visit: Payer: Self-pay | Admitting: Family Medicine

## 2019-05-05 DIAGNOSIS — M1A9XX0 Chronic gout, unspecified, without tophus (tophi): Secondary | ICD-10-CM

## 2019-06-03 ENCOUNTER — Other Ambulatory Visit: Payer: Self-pay | Admitting: Family Medicine

## 2019-06-03 DIAGNOSIS — M1A9XX0 Chronic gout, unspecified, without tophus (tophi): Secondary | ICD-10-CM

## 2019-07-04 ENCOUNTER — Other Ambulatory Visit: Payer: Self-pay | Admitting: Family Medicine

## 2019-07-04 DIAGNOSIS — M1A9XX0 Chronic gout, unspecified, without tophus (tophi): Secondary | ICD-10-CM

## 2019-08-03 ENCOUNTER — Other Ambulatory Visit: Payer: Self-pay

## 2019-08-06 ENCOUNTER — Ambulatory Visit: Payer: PRIVATE HEALTH INSURANCE | Admitting: Family Medicine

## 2019-08-06 DIAGNOSIS — Z0289 Encounter for other administrative examinations: Secondary | ICD-10-CM

## 2019-08-07 ENCOUNTER — Ambulatory Visit: Payer: 59 | Admitting: Family Medicine

## 2019-08-07 ENCOUNTER — Other Ambulatory Visit: Payer: Self-pay

## 2019-08-07 ENCOUNTER — Encounter: Payer: Self-pay | Admitting: Family Medicine

## 2019-08-07 VITALS — BP 132/98 | HR 128 | Temp 96.0°F | Ht 67.0 in | Wt 135.0 lb

## 2019-08-07 DIAGNOSIS — R030 Elevated blood-pressure reading, without diagnosis of hypertension: Secondary | ICD-10-CM | POA: Diagnosis not present

## 2019-08-07 DIAGNOSIS — M7122 Synovial cyst of popliteal space [Baker], left knee: Secondary | ICD-10-CM

## 2019-08-07 NOTE — Progress Notes (Signed)
Chief Complaint  Patient presents with  . Knee Pain    left    Subjective: Patient is a 54 y.o. male here for knee issue.  For the past 2 months, the patient has had a lump in the back of his left knee.  It is not painful but he thinks it is growing.  There is no redness, bruising, or swelling in the knee.  He is walking fine.  His range of motion is affected by this mass.  It does not affect his activity of daily living.  No family history of this.  Past Medical History:  Diagnosis Date  . GERD (gastroesophageal reflux disease)     Objective: BP (!) 132/98 (BP Location: Left Arm, Patient Position: Sitting, Cuff Size: Normal)   Pulse (!) 128   Temp (!) 96 F (35.6 C) (Temporal)   Ht 5\' 7"  (1.702 m)   Wt 135 lb (61.2 kg)   SpO2 98%   BMI 21.14 kg/m  General: Awake, appears stated age MSK: There is no tenderness to palpation around the joint line or bony landmarks of the left knee.  There is a soft semisolid outpouching behind the left knee that is not tender to palpation, excessively warm, or with any skin pigment change or erythema.  He has decent range of motion of his knee. Heart: RRR Lungs: CTAB, no rales, wheezes or rhonchi. No accessory muscle use Psych: Age appropriate judgment and insight, normal affect and mood  Assessment and Plan: Baker's cyst of knee, left - Plan: Ambulatory referral to Orthopedic Surgery  Elevated blood pressure reading  1-because it is growing, we will get the opinion of the orthopedic surgery team. 2-with his elevated blood pressure, I would like him to come back in 2 weeks for nurse blood pressure visit.  If it is elevated, I would like him to purchase a home blood pressure monitor and follow-up with me in 6 weeks after the nurse visit.  If it is normal, will have her follow-up as originally scheduled. The patient voiced understanding and agreement to the plan.  [place], DO 08/07/19  11:53 AM

## 2019-08-07 NOTE — Patient Instructions (Addendum)
If you do not hear anything about your referral in the next 1-2 weeks, call our office and ask for an update.  Keep the diet clean and stay active.  Let us know if you need anything.   Baker Cyst  A Baker cyst, also called a popliteal cyst, is a growth that forms at the back of the knee. The cyst forms when the fluid-filled sac (bursa) that cushions the knee joint becomes enlarged. What are the causes? In most cases, a Baker cyst results from another knee problem that causes swelling inside the knee. This makes the fluid inside the knee joint (synovial fluid) flow into the bursa behind the knee, causing the bursa to enlarge. What increases the risk? You may be more likely to develop a Baker cyst if you already have a knee problem, such as:  A tear in cartilage that cushions the knee joint (meniscal tear).  A tear in the tissues that connect the bones of the knee joint (ligament tear).  Knee swelling from osteoarthritis, rheumatoid arthritis, or gout. What are the signs or symptoms? The main symptom of this condition is a lump behind the knee. This may be the only symptom of the condition. The lump may be painful, especially when the knee is straightened. If the lump is painful, the pain may come and go. The knee may also be stiff. Symptoms may quickly get more severe if the cyst breaks open (ruptures). If the cyst ruptures, you may feel the following in your knee and calf:  Sudden or worsening pain.  Swelling.  Bruising.  Redness in the calf. A Baker cyst does not always cause symptoms. How is this diagnosed? This condition may be diagnosed based on your symptoms and medical history. Your health care provider will also do a physical exam. This may include:  Feeling the cyst to check whether it is tender.  Checking your knee for signs of another knee condition that causes swelling. You may have imaging tests, such as:  X-rays.  MRI.  Ultrasound. How is this treated? A  Baker cyst that is not painful may go away without treatment. If the cyst gets large or painful, it will likely get better if the underlying knee problem is treated. If needed, treatment for a Baker cyst may include:  Resting.  Keeping weight off of the knee. This means not leaning on the knee to support your body weight.  Taking NSAIDs, such as ibuprofen, to reduce pain and swelling.  Having a procedure to drain the fluid from the cyst with a needle (aspiration). You may also get an injection of a medicine that reduces swelling (steroid).  Having surgery. This may be needed if other treatments do not work. This usually involves correcting knee damage and removing the cyst. Follow these instructions at home:  Activity  Rest as told by your health care provider.  Avoid activities that make pain or swelling worse.  Return to your normal activities as told by your health care provider. Ask your health care provider what activities are safe for you.  Do not use the injured limb to support your body weight until your health care provider says that you can. Use crutches as told by your health care provider. General instructions  Take over-the-counter and prescription medicines only as told by your health care provider.  Keep all follow-up visits as told by your health care provider. This is important. Contact a health care provider if:  You have knee pain, stiffness, or swelling  that does not get better. Get help right away if:  You have sudden or worsening pain and swelling in your calf area. Summary  A Baker cyst, also called a popliteal cyst, is a growth that forms at the back of the knee.  In most cases, a Baker cyst results from another knee problem that causes swelling inside the knee.  A Baker cyst that is not painful may go away without treatment.  If needed, treatment for a Baker cyst may include resting, keeping weight off of the knee, medicines, or draining fluid from  the cyst.  Surgery may be needed if other treatments are not effective. This information is not intended to replace advice given to you by your health care provider. Make sure you discuss any questions you have with your health care provider. Document Revised: 08/25/2018 Document Reviewed: 08/25/2018 Elsevier Patient Education  [employer].

## 2019-08-08 ENCOUNTER — Other Ambulatory Visit: Payer: Self-pay | Admitting: Family Medicine

## 2019-08-08 DIAGNOSIS — M1A9XX0 Chronic gout, unspecified, without tophus (tophi): Secondary | ICD-10-CM

## 2019-08-16 ENCOUNTER — Other Ambulatory Visit: Payer: Self-pay

## 2019-08-16 ENCOUNTER — Encounter: Payer: Self-pay | Admitting: Orthopaedic Surgery

## 2019-08-16 ENCOUNTER — Ambulatory Visit (INDEPENDENT_AMBULATORY_CARE_PROVIDER_SITE_OTHER): Payer: 59 | Admitting: Orthopaedic Surgery

## 2019-08-16 ENCOUNTER — Ambulatory Visit (INDEPENDENT_AMBULATORY_CARE_PROVIDER_SITE_OTHER): Payer: 59

## 2019-08-16 DIAGNOSIS — G8929 Other chronic pain: Secondary | ICD-10-CM | POA: Diagnosis not present

## 2019-08-16 DIAGNOSIS — M25562 Pain in left knee: Secondary | ICD-10-CM

## 2019-08-16 MED ORDER — MELOXICAM 7.5 MG PO TABS: 7.5000 mg | ORAL_TABLET | Freq: Two times a day (BID) | ORAL | 2 refills | Status: DC | PRN

## 2019-08-16 NOTE — Progress Notes (Signed)
Office Visit Note   Patient: Shawn Rowland           Date of Birth: [birth year]           MRN: [id number] Visit Date: 08/16/2019              Requested by: Sharlene Dory, DO [address] [place],  [place] [address] PCP: Sharlene Dory, DO   Assessment & Plan: Visit Diagnoses:  1. Chronic pain of left knee     Plan: Impression is chronic left knee pain possibly osteoarthritis.  After discussion of treatment options patient would like to start with some prescription NSAIDs and Voltaren gel and a knee brace.  Patient will call us back if he is interested in trying cortisone injection.  Follow-Up Instructions: Return if symptoms worsen or fail to improve.   Orders:  Orders Placed This Encounter  Procedures  . XR KNEE 3 VIEW LEFT   Meds ordered this encounter  Medications  . meloxicam (MOBIC) 7.5 MG tablet    Sig: Take 1 tablet (7.5 mg total) by mouth 2 (two) times daily as needed for pain.    Dispense:  30 tablet    Refill:  2      Procedures: No procedures performed   Clinical Data: No additional findings.   Subjective: Chief Complaint  Patient presents with  . Left Knee - Pain    Patient is a 54 year old gentleman who comes in for evaluation of chronic left knee pain for at least several weeks.  He denies any injuries.  He does have a history of gout.  He feels a cyst in the back of his knee.  He denies any mechanical symptoms.  He has trouble bending his knee because it feels stiff and swollen.  Denies any numbness and tingling.   Review of Systems  Constitutional: Negative.   All other systems reviewed and are negative.    Objective: Vital Signs: There were no vitals taken for this visit.  Physical Exam Vitals and nursing note reviewed.  Constitutional:      Appearance: He is well-developed.  HENT:     Head: Normocephalic and atraumatic.  Eyes:     Pupils: Pupils are equal, round, and reactive to light.    Pulmonary:     Effort: Pulmonary effort is normal.  Abdominal:     Palpations: Abdomen is soft.  Musculoskeletal:        General: Normal range of motion.     Cervical back: Neck supple.  Skin:    General: Skin is warm.  Neurological:     Mental Status: He is alert and oriented to person, place, and time.  Psychiatric:        Behavior: Behavior normal.        Thought Content: Thought content normal.        Judgment: Judgment normal.     Ortho Exam Left knee shows a trace effusion.  Minimal restriction in range of motion.  Collaterals and cruciates are stable.  The knee is not warm or red. Specialty Comments:  No specialty comments available.  Imaging: XR KNEE 3 VIEW LEFT  Result Date: 08/16/2019 No acute or structural abnormalities    PMFS History: Patient Active Problem List   Diagnosis Date Noted  . Chronic gout involving toe of right foot without tophus 01/20/2018  . Refused influenza vaccine 01/20/2018   Past Medical History:  Diagnosis Date  . GERD (gastroesophageal reflux disease)  Family History  Problem Relation Age of Onset  . Gout Father   . Cancer Neg Hx     Past Surgical History:  Procedure Laterality Date  . NO PAST SURGERIES     Social History   Occupational History  . Not on file  Tobacco Use  . Smoking status: Never Smoker  . Smokeless tobacco: Never Used  Substance and Sexual Activity  . Alcohol use: Yes    Comment: weekly  . Drug use: No  . Sexual activity: Yes    Partners: Female

## 2019-08-23 ENCOUNTER — Ambulatory Visit (INDEPENDENT_AMBULATORY_CARE_PROVIDER_SITE_OTHER): Payer: 59

## 2019-08-23 ENCOUNTER — Other Ambulatory Visit: Payer: Self-pay

## 2019-08-23 DIAGNOSIS — R030 Elevated blood-pressure reading, without diagnosis of hypertension: Secondary | ICD-10-CM | POA: Diagnosis not present

## 2019-08-23 NOTE — Progress Notes (Signed)
Pt here for Blood pressure check per   Pt currently takes: Not taking anything currently.    BP today @ = 110/92 HR = 92  Pt advised per [place] to purchase BP cuff and monitor blood pressure for a 1 week and to let us know of the results.

## 2019-08-24 ENCOUNTER — Ambulatory Visit: Payer: 59 | Attending: Internal Medicine

## 2019-08-24 DIAGNOSIS — Z23 Encounter for immunization: Secondary | ICD-10-CM

## 2019-08-24 NOTE — Progress Notes (Signed)
   Covid-19 Vaccination Clinic  Name:  Shawn Rowland    MRN: [id number] DOB: 1965-08-10  08/24/2019  Mr. Shawn Rowland was observed post Covid-19 immunization for 15 minutes without incident. He was provided with Vaccine Information Sheet and instruction to access the V-Safe system.   Mr. Shawn Rowland was instructed to call 911 with any severe reactions post vaccine: Marland Kitchen Difficulty breathing  . Swelling of face and throat  . A fast heartbeat  . A bad rash all over body  . Dizziness and weakness   Immunizations Administered    Name Date Dose VIS Date Route   Pfizer COVID-19 Vaccine 08/24/2019  5:06 PM 0.3 mL 06/20/2018 Intramuscular   Manufacturer: [employer], [employer]   Lot: [id number]   NDC: [address]-1

## 2019-09-13 ENCOUNTER — Other Ambulatory Visit: Payer: Self-pay | Admitting: Family Medicine

## 2019-09-13 DIAGNOSIS — M1A9XX0 Chronic gout, unspecified, without tophus (tophi): Secondary | ICD-10-CM

## 2019-09-17 ENCOUNTER — Ambulatory Visit: Payer: 59 | Attending: Internal Medicine

## 2019-09-17 DIAGNOSIS — Z23 Encounter for immunization: Secondary | ICD-10-CM

## 2019-09-17 NOTE — Progress Notes (Signed)
   Covid-19 Vaccination Clinic  Name:  Shawn Rowland    MRN: [id number] DOB: May 13, 1965  09/17/2019  [name] was observed post Covid-19 immunization for 15 minutes without incident. He was provided with Vaccine Information Sheet and instruction to access the V-Safe system.   [name] was instructed to call 911 with any severe reactions post vaccine: Marland Kitchen Difficulty breathing  . Swelling of face and throat  . A fast heartbeat  . A bad rash all over body  . Dizziness and weakness   Immunizations Administered    Name Date Dose VIS Date Route   Pfizer COVID-19 Vaccine 09/17/2019  4:46 PM 0.3 mL 06/20/2018 Intramuscular   Manufacturer: [employer], [employer]   Lot: [id number]   NDC: [address]-1

## 2019-10-24 ENCOUNTER — Emergency Department (HOSPITAL_BASED_OUTPATIENT_CLINIC_OR_DEPARTMENT_OTHER): Payer: BLUE CROSS/BLUE SHIELD

## 2019-10-24 ENCOUNTER — Encounter (HOSPITAL_BASED_OUTPATIENT_CLINIC_OR_DEPARTMENT_OTHER): Payer: Self-pay

## 2019-10-24 ENCOUNTER — Other Ambulatory Visit: Payer: Self-pay

## 2019-10-24 ENCOUNTER — Emergency Department (HOSPITAL_BASED_OUTPATIENT_CLINIC_OR_DEPARTMENT_OTHER)
Admission: EM | Admit: 2019-10-24 | Discharge: 2019-10-25 | Disposition: A | Discharge status: Home / Self Care | Payer: BLUE CROSS/BLUE SHIELD | Attending: Emergency Medicine | Admitting: Emergency Medicine

## 2019-10-24 DIAGNOSIS — E041 Nontoxic single thyroid nodule: Secondary | ICD-10-CM | POA: Diagnosis not present

## 2019-10-24 DIAGNOSIS — K8050 Calculus of bile duct without cholangitis or cholecystitis without obstruction: Secondary | ICD-10-CM | POA: Insufficient documentation

## 2019-10-24 DIAGNOSIS — I10 Essential (primary) hypertension: Secondary | ICD-10-CM | POA: Diagnosis not present

## 2019-10-24 DIAGNOSIS — E876 Hypokalemia: Secondary | ICD-10-CM | POA: Diagnosis not present

## 2019-10-24 DIAGNOSIS — R000 Tachycardia, unspecified: Secondary | ICD-10-CM | POA: Insufficient documentation

## 2019-10-24 DIAGNOSIS — R1013 Epigastric pain: Secondary | ICD-10-CM | POA: Diagnosis present

## 2019-10-24 LAB — COMPREHENSIVE METABOLIC PANEL
ALT: 23 U/L (ref 0–44)
AST: 30 U/L (ref 15–41)
Albumin: 3.8 g/dL (ref 3.5–5.0)
Alkaline Phosphatase: 67 U/L (ref 38–126)
Anion gap: 13 (ref 5–15)
BUN: 10 mg/dL (ref 6–20)
CO2: 20 mmol/L — ABNORMAL LOW (ref 22–32)
Calcium: 8.3 mg/dL — ABNORMAL LOW (ref 8.9–10.3)
Chloride: 103 mmol/L (ref 98–111)
Creatinine, Ser: 0.88 mg/dL (ref 0.61–1.24)
GFR calc Af Amer: >60 mL/min (ref >60)
GFR calc non Af Amer: >60 mL/min (ref >60)
Glucose, Bld: 111 mg/dL — ABNORMAL HIGH (ref 70–99)
Potassium: 3 mmol/L — ABNORMAL LOW (ref 3.5–5.1)
Sodium: 136 mmol/L (ref 135–145)
Total Bilirubin: 0.7 mg/dL (ref 0.3–1.2)
Total Protein: 6.8 g/dL (ref 6.5–8.1)

## 2019-10-24 LAB — CBC WITH DIFFERENTIAL/PLATELET
Abs Immature Granulocytes: 0.02 10*3/uL (ref 0.00–0.07)
Basophils Absolute: 0 10*3/uL (ref 0.0–0.1)
Basophils Relative: 1 %
Eosinophils Absolute: 0.1 10*3/uL (ref 0.0–0.5)
Eosinophils Relative: 2 %
HCT: 38.2 % — ABNORMAL LOW (ref 39.0–52.0)
Hemoglobin: 13.4 g/dL (ref 13.0–17.0)
Immature Granulocytes: 0 %
Lymphocytes Relative: 37 %
Lymphs Abs: 2.1 10*3/uL (ref 0.7–4.0)
MCH: 34.4 pg — ABNORMAL HIGH (ref 26.0–34.0)
MCHC: 35.1 g/dL (ref 30.0–36.0)
MCV: 97.9 fL (ref 80.0–100.0)
Monocytes Absolute: 0.4 10*3/uL (ref 0.1–1.0)
Monocytes Relative: 7 %
Neutro Abs: 3.1 10*3/uL (ref 1.7–7.7)
Neutrophils Relative %: 53 %
Platelets: 171 10*3/uL (ref 150–400)
RBC: 3.9 MIL/uL — ABNORMAL LOW (ref 4.22–5.81)
RDW: 12.9 % (ref 11.5–15.5)
WBC: 5.8 10*3/uL (ref 4.0–10.5)
nRBC: 0 % (ref 0.0–0.2)

## 2019-10-24 LAB — URINALYSIS, ROUTINE W REFLEX MICROSCOPIC
Bilirubin Urine: NEGATIVE
Glucose, UA: NEGATIVE mg/dL
Hgb urine dipstick: NEGATIVE
Ketones, ur: NEGATIVE mg/dL
Leukocytes,Ua: NEGATIVE
Nitrite: NEGATIVE
Protein, ur: NEGATIVE mg/dL
Specific Gravity, Urine: <1.005 — ABNORMAL LOW (ref 1.005–1.030)
pH: 6.5 (ref 5.0–8.0)

## 2019-10-24 LAB — TROPONIN I (HIGH SENSITIVITY): Troponin I (High Sensitivity): 3 ng/L (ref <18)

## 2019-10-24 LAB — LIPASE, BLOOD: Lipase: 39 U/L (ref 11–51)

## 2019-10-24 MED ORDER — POTASSIUM CHLORIDE 10 MEQ/100ML IV SOLN
10.0000 meq | Freq: Once | INTRAVENOUS | Status: AC
  Administered 2019-10-24: 10 meq via INTRAVENOUS
  Filled 2019-10-24: qty 100

## 2019-10-24 MED ORDER — LABETALOL HCL 5 MG/ML IV SOLN
20.0000 mg | Freq: Once | INTRAVENOUS | Status: AC
  Administered 2019-10-24: 20 mg via INTRAVENOUS
  Filled 2019-10-24: qty 4

## 2019-10-24 MED ORDER — IOHEXOL 350 MG/ML SOLN
100.0000 mL | Freq: Once | INTRAVENOUS | Status: AC | PRN
  Administered 2019-10-24: 100 mL via INTRAVENOUS

## 2019-10-24 MED ORDER — POTASSIUM CHLORIDE CRYS ER 20 MEQ PO TBCR
20.0000 meq | EXTENDED_RELEASE_TABLET | Freq: Every day | ORAL | 0 refills | Status: DC
Start: 2019-10-24 — End: 2021-09-15

## 2019-10-24 MED ORDER — SODIUM CHLORIDE 0.9% FLUSH
3.0000 mL | Freq: Once | INTRAVENOUS | Status: DC
  Filled 2019-10-24: qty 3

## 2019-10-24 NOTE — Discharge Instructions (Addendum)
Stay hydrated.   Your potassium is slightly low. Take potassium for 3 days. Repeat potassium level in a week with your doctor   Take tylenol, motrin for pain   Avoid eating fatty foods   You have incidental thyroid nodule that can be followed up with your doctor   See surgery for follow up for gallstones   Return to ER if you have worse abdominal pain, vomiting, fever, chest pain

## 2019-10-24 NOTE — ED Provider Notes (Signed)
[facility] EMERGENCY DEPARTMENT Provider Note   CSN: [id number] Arrival date & time: 10/24/19  2150     History Chief Complaint  Patient presents with  . Abdominal Pain    Lem Treanor is a 54 y.o. male hx of GERD, gout, here with epigastric pain, back pain.  Patient has acute onset of chest pain and nausea and epigastric pain lasted for several minutes.  Patient states that he has some shortness of breath at that time as well.  Denies any nausea or vomiting.  She was patient was noted to be hypertensive and tachycardic in triage.  Denies any history of hypertension.  Some subjective left arm numbness as well.  Denies any history of CAD or stents.  The history is provided by the patient.       Past Medical History:  Diagnosis Date  . GERD (gastroesophageal reflux disease)     Patient Active Problem List   Diagnosis Date Noted  . Chronic gout involving toe of right foot without tophus 01/20/2018  . Refused influenza vaccine 01/20/2018    Past Surgical History:  Procedure Laterality Date  . NO PAST SURGERIES         Family History  Problem Relation Age of Onset  . Gout Father   . Cancer Neg Hx     Social History   Tobacco Use  . Smoking status: Never Smoker  . Smokeless tobacco: Never Used  Vaping Use  . Vaping Use: Never used  Substance Use Topics  . Alcohol use: Yes    Comment: weekly  . Drug use: No    Home Medications Prior to Admission medications   Medication Sig Start Date End Date Taking? Authorizing Provider  allopurinol (ZYLOPRIM) 300 MG tablet TAKE 1/2 TABLET BY MOUTH DAILY 05/01/19   Wendling, Jilda Roche, DO  colchicine 0.6 MG tablet TAKE 2 TABS AT BEGINNING OF FLARE, 1 TAB 1 HR LATER,1 TAB DAILY UNTIL FLARE RESOLVES 09/14/19   Sharlene Dory, DO  indomethacin (INDOCIN) 50 MG capsule TAKE 1 CAPSULE (50 MG TOTAL) BY MOUTH 2 (TWO) TIMES DAILY WITH A MEAL. 07/04/19   Sharlene Dory, DO  meloxicam (MOBIC) 7.5 MG  tablet Take 1 tablet (7.5 mg total) by mouth 2 (two) times daily as needed for pain. 08/16/19   Tarry Kos, MD  potassium chloride SA (KLOR-CON) 20 MEQ tablet Take 1 tablet (20 mEq total) by mouth daily. 10/24/19   Charlynne Pander, MD    Allergies    Patient has no known allergies.  Review of Systems   Review of Systems  Gastrointestinal: Positive for abdominal pain.  All other systems reviewed and are negative.   Physical Exam Updated Vital Signs BP (!) 155/105   Pulse 98   Temp 99.2 F (37.3 C) (Oral)   Resp 19   Ht 5\' 6"  (1.676 m)   Wt 62.1 kg   SpO2 99%   BMI 22.11 kg/m   Physical Exam Vitals and nursing note reviewed.  HENT:     Head: Normocephalic.     Mouth/Throat:     Mouth: Mucous membranes are moist.  Eyes:     Extraocular Movements: Extraocular movements intact.  Cardiovascular:     Rate and Rhythm: Normal rate and regular rhythm.  Pulmonary:     Effort: Pulmonary effort is normal.     Breath sounds: Normal breath sounds.  Abdominal:     General: Abdomen is flat.     Palpations: Abdomen is soft.  Comments: Mild epigastric tenderness, no CVAT   Skin:    General: Skin is warm.     Capillary Refill: Capillary refill takes less than 2 seconds.  Neurological:     General: No focal deficit present.     Mental Status: He is alert and oriented to person, place, and time.  Psychiatric:        Mood and Affect: Mood normal.        Behavior: Behavior normal.     ED Results / Procedures / Treatments   Labs (all labs ordered are listed, but only abnormal results are displayed) Labs Reviewed  COMPREHENSIVE METABOLIC PANEL - Abnormal; Notable for the following components:      Result Value   Potassium 3.0 (*)    CO2 20 (*)    Glucose, Bld 111 (*)    Calcium 8.3 (*)    All other components within normal limits  CBC WITH DIFFERENTIAL/PLATELET - Abnormal; Notable for the following components:   RBC 3.90 (*)    HCT 38.2 (*)    MCH 34.4 (*)    All  other components within normal limits  LIPASE, BLOOD  URINALYSIS, ROUTINE W REFLEX MICROSCOPIC  TROPONIN I (HIGH SENSITIVITY)    EKG EKG Interpretation  Date/Time:  Wednesday October 24 2019 21:52:00 EDT Ventricular Rate:  121 PR Interval:  162 QRS Duration: 78 QT Interval:  310 QTC Calculation: 440 R Axis:   9 Text Interpretation: Sinus tachycardia Otherwise normal ECG Since last tracing rate faster Confirmed by Richardean Canal [phone]) on 10/24/2019 10:43:08 PM   Radiology CT Angio Chest/Abd/Pel for Dissection W and/or Wo Contrast  Result Date: 10/24/2019 CLINICAL DATA:  Chest pain or back pain, aortic dissection suspected EXAM: CT ANGIOGRAPHY CHEST, ABDOMEN AND PELVIS TECHNIQUE: Non-contrast CT of the chest was initially obtained. Multidetector CT imaging through the chest, abdomen and pelvis was performed using the standard protocol during bolus administration of intravenous contrast. Multiplanar reconstructed images and MIPs were obtained and reviewed to evaluate the vascular anatomy. CONTRAST:  OMNIPAQUE IOHEXOL 350 MG/ML SOLN COMPARISON:  None. FINDINGS: CTA CHEST FINDINGS Cardiovascular: Thoracic aorta is normal in caliber. There is no aortic hematoma, dissection, aneurysm, or acute aortic syndrome. 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 The heart is normal in size. No pericardial effusion. There is no central pulmonary embolus to the lobar level. Mediastinum/Nodes: No enlarged mediastinal or hilar lymph nodes. Decompressed esophagus. There is a 2.3 cm left thyroid nodule with peripheral calcification. Lungs/Pleura: Minor subsegmental atelectasis in the dependent right lower lobe. Lungs otherwise clear. No pleural fluid. No pulmonary edema. No confluent airspace disease. The trachea and central bronchi are patent. No pulmonary mass. Musculoskeletal: There are no acute or suspicious osseous abnormalities. Review of the MIP images confirms the above  findings. CTA ABDOMEN AND PELVIS FINDINGS VASCULAR Aorta: Normal caliber aorta without aneurysm, dissection, vasculitis or significant stenosis. Celiac: Patent without evidence of aneurysm, dissection, vasculitis or significant stenosis. SMA: Patent without evidence of aneurysm, dissection, vasculitis or significant stenosis. Renals: Both renal arteries are patent without evidence of aneurysm, dissection, vasculitis, fibromuscular dysplasia or significant stenosis. IMA: Patent without evidence of aneurysm, dissection, vasculitis or significant stenosis. Inflow: Patent without evidence of aneurysm, dissection, vasculitis or significant stenosis. Veins: No obvious venous abnormality within the limitations of this arterial phase study. Review of the MIP images confirms the above findings. NON-VASCULAR Hepatobiliary: Peripherally calcified gallstone in the gallbladder. No evidence of pericholecystic inflammation. No biliary dilatation. No  focal hepatic lesion on arterial phase imaging. Pancreas: No ductal dilatation or inflammation. Spleen: Normal in size and arterial enhancement. Adrenals/Urinary Tract: Normal adrenal glands. No hydronephrosis or perinephric edema. Small left renal cysts. No visualized renal calculi. Urinary bladder is partially distended. Stomach/Bowel: Stomach is within normal limits. Appendix appears normal. No evidence of bowel wall thickening, distention, or inflammatory changes. Lymphatic: No enlarged lymph nodes in the abdomen or pelvis. Left lower quadrant calcifications are likely calcified nodes and sequela of prior granulomatous disease. Reproductive: Prominent prostate gland spans 5.1 cm transverse. Other: No free air, free fluid, or intra-abdominal fluid collection. Tiny fat containing umbilical hernia. Musculoskeletal: Punctate sclerotic density in the left femoral head and left iliac bone likely a bone island. There are no acute or suspicious osseous abnormalities. Review of the MIP  images confirms the above findings. IMPRESSION: 1. Normal thoracoabdominal aorta without dissection or acute aortic abnormality. Aberrant right subclavian artery. 2. Left thyroid nodule measuring 2.3 cm. Recommend thyroid US (ref: J Am Coll Radiol. 2015 Feb;12(2): 143-50). This should be performed on an elective outpatient basis. 3. No acute abnormality in the chest, abdomen, or pelvis. 4. Gallstone without gallbladder inflammation. Electronically Signed   By: Narda Rutherford M.D.   On: 10/24/2019 23:18    Procedures Procedures (including critical care time)  Medications Ordered in ED Medications  sodium chloride flush (NS) 0.9 % injection 3 mL (3 mLs Intravenous Not Given 10/24/19 2203)  potassium chloride 10 mEq in 100 mL IVPB (10 mEq Intravenous New Bag/Given 10/24/19 2327)  labetalol (NORMODYNE) injection 20 mg (20 mg Intravenous Given 10/24/19 2232)  iohexol (OMNIPAQUE) 350 MG/ML injection 100 mL (100 mLs Intravenous Contrast Given 10/24/19 2303)    ED Course  I have reviewed the triage vital signs and the nursing notes.  Pertinent labs & imaging results that were available during my care of the patient were reviewed by me and considered in my medical decision making (see chart for details).    MDM Rules/Calculators/A&P                          Zakariyah Kau is a 54 y.o. male here presenting with epigastric pain and back pain and chest pain and left arm numbness.  Patient is tachycardic and hypertensive.  I am concerned initially for possible dissection.  Also consider biliary colic or gallstone pancreatitis as well.  PE and ACS is less likely.  Plan to get CT dissection study, as well as CBC, CMP, troponin x2, lipase.  11:33 PM CT dissection study showed no dissection.  Patient does have some gallstones but no evidence of cholecystitis.  His LFTs are normal.  Patient did not require any pain medicine in the ED.  I am suspicious that he may have biliary colic.  Signed out to Dr. Read Drivers  pending repeat troponin.  If it is normal, patient can be discharged home with surgery follow-up.  Will put patient on potassium supplementation as well  Final Clinical Impression(s) / ED Diagnoses Final diagnoses:  Biliary colic  Essential hypertension  Hypokalemia  Thyroid nodule    Rx / DC Orders ED Discharge Orders         Ordered    potassium chloride SA (KLOR-CON) 20 MEQ tablet  Daily     Discontinue  Reprint     10/24/19 2331           Charlynne Pander, MD 10/24/19 [phone]

## 2019-10-24 NOTE — ED Triage Notes (Signed)
Pt c/o abd pain, nausea, SOB x 1 hour-NAD-steady gait-entered triage talking on cell phone

## 2019-10-25 DIAGNOSIS — R1013 Epigastric pain: Secondary | ICD-10-CM | POA: Diagnosis not present

## 2019-10-25 DIAGNOSIS — R000 Tachycardia, unspecified: Secondary | ICD-10-CM | POA: Diagnosis not present

## 2019-10-25 DIAGNOSIS — E041 Nontoxic single thyroid nodule: Secondary | ICD-10-CM | POA: Diagnosis not present

## 2019-10-25 DIAGNOSIS — I10 Essential (primary) hypertension: Secondary | ICD-10-CM | POA: Diagnosis not present

## 2019-10-25 DIAGNOSIS — K8050 Calculus of bile duct without cholangitis or cholecystitis without obstruction: Secondary | ICD-10-CM | POA: Diagnosis not present

## 2019-10-25 DIAGNOSIS — E876 Hypokalemia: Secondary | ICD-10-CM | POA: Diagnosis not present

## 2019-10-25 LAB — TROPONIN I (HIGH SENSITIVITY): Troponin I (High Sensitivity): 4 ng/L (ref <18)

## 2020-11-03 IMAGING — DX PORTABLE CHEST - 1 VIEW
1 series · 1 of 1 positions shown · non-contrast
Comparison: None.

CLINICAL DATA: Short of breath.  Former smoker.

EXAM:
PORTABLE CHEST 1 VIEW

[chest ap]
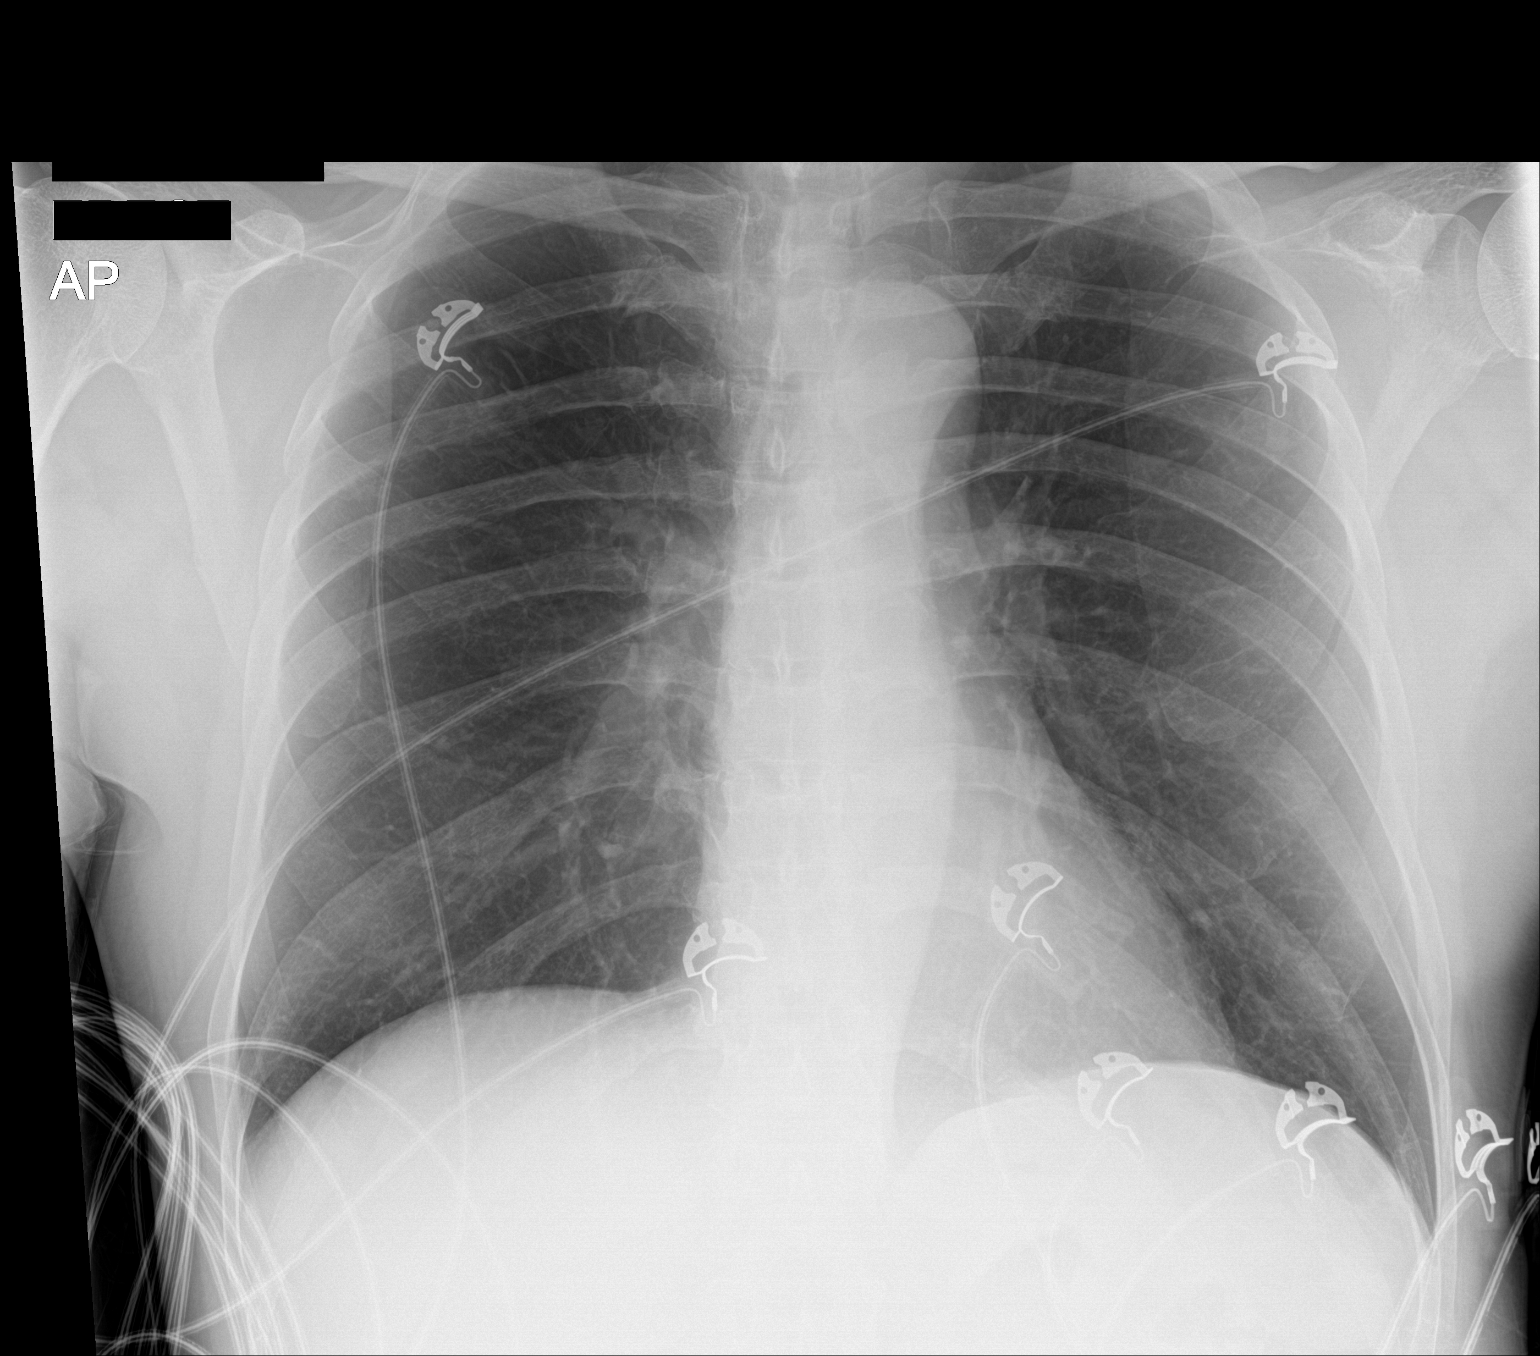

[1 of 1 positions shown; findings below may reference images not displayed]

FINDINGS: The heart size and mediastinal contours are within normal limits.
Both lungs are clear. The visualized skeletal structures are
unremarkable.
IMPRESSION: No active disease.

## 2021-02-10 ENCOUNTER — Ambulatory Visit: Payer: BLUE CROSS/BLUE SHIELD | Admitting: Orthopaedic Surgery

## 2021-02-10 ENCOUNTER — Encounter: Payer: Self-pay | Admitting: Orthopaedic Surgery

## 2021-02-10 ENCOUNTER — Other Ambulatory Visit: Payer: Self-pay

## 2021-02-10 ENCOUNTER — Other Ambulatory Visit: Payer: Self-pay | Admitting: Physician Assistant

## 2021-02-10 ENCOUNTER — Ambulatory Visit (INDEPENDENT_AMBULATORY_CARE_PROVIDER_SITE_OTHER): Payer: BC Managed Care – PPO

## 2021-02-10 DIAGNOSIS — M25562 Pain in left knee: Secondary | ICD-10-CM

## 2021-02-10 DIAGNOSIS — G8929 Other chronic pain: Secondary | ICD-10-CM | POA: Diagnosis not present

## 2021-02-10 MED ORDER — MELOXICAM 7.5 MG PO TABS: 7.5000 mg | ORAL_TABLET | Freq: Two times a day (BID) | ORAL | 2 refills | Status: DC | PRN

## 2021-02-10 NOTE — Progress Notes (Signed)
   Office Visit Note   Patient: Shawn Rowland           Date of Birth: 07-17-1965           MRN: [id number] Visit Date: 02/10/2021              Requested by: Sharlene Dory, DO [address] [place],  [place] [address] PCP: Sharlene Dory, DO   Assessment & Plan: Visit Diagnoses:  1. Chronic pain of left knee     Plan: Impression is left knee arthritis flareup.  We have discussed refilling the meloxicam versus intra-articular cortisone injection.  The patient has had great relief from the meloxicam in the past and would like to continue this.  I have called in today.  Follow-up with [place] as needed.  Follow-Up Instructions: No follow-ups on file.   Orders:  Orders Placed This Encounter  Procedures   XR KNEE 3 VIEW LEFT   No orders of the defined types were placed in this encounter.     Procedures: No procedures performed   Clinical Data: No additional findings.   Subjective: Chief Complaint  Patient presents with   Left Knee - Pain    HPI patient is a pleasant 55 year old gentleman who comes in today with recurrent left knee pain and swelling to the posterior aspect.  He noticed this initially about a year and a half ago.  He was seen in our office where he was treated with meloxicam, a brace and Voltaren gel with great relief.  He is here today as he recently ran out of the meloxicam.  He does note that he is getting slight swelling.  Pain is worse with knee flexion. Review of Systems as detailed in HPI.  All others reviewed are negative.   Objective: Vital Signs: There were no vitals taken for this visit.  Physical Exam well-developed well-nourished gentleman in no acute distress.  Alert and oriented x3.  Ortho Exam left knee exam shows no swelling.  No tenderness to the popliteal fossa.  No joint line tenderness.  Range of motion 0 to 120 degrees.  He is neurovascular intact distally.  Specialty Comments:  No specialty comments  available.  Imaging: No results found.   PMFS History: Patient Active Problem List   Diagnosis Date Noted   Chronic gout involving toe of right foot without tophus 01/20/2018   Refused influenza vaccine 01/20/2018   Past Medical History:  Diagnosis Date   GERD (gastroesophageal reflux disease)     Family History  Problem Relation Age of Onset   Gout Father    Cancer Neg Hx     Past Surgical History:  Procedure Laterality Date   NO PAST SURGERIES     Social History   Occupational History   Not on file  Tobacco Use   Smoking status: Never   Smokeless tobacco: Never  Vaping Use   Vaping Use: Never used  Substance and Sexual Activity   Alcohol use: Yes    Comment: weekly   Drug use: No   Sexual activity: Not on file

## 2021-09-15 ENCOUNTER — Ambulatory Visit (INDEPENDENT_AMBULATORY_CARE_PROVIDER_SITE_OTHER): Payer: BC Managed Care – PPO | Admitting: Family Medicine

## 2021-09-15 ENCOUNTER — Encounter: Payer: Self-pay | Admitting: Family Medicine

## 2021-09-15 VITALS — BP 122/80 | HR 76 | Temp 98.3°F | Ht 67.0 in | Wt 139.2 lb

## 2021-09-15 DIAGNOSIS — Z1159 Encounter for screening for other viral diseases: Secondary | ICD-10-CM

## 2021-09-15 DIAGNOSIS — Z125 Encounter for screening for malignant neoplasm of prostate: Secondary | ICD-10-CM | POA: Diagnosis not present

## 2021-09-15 DIAGNOSIS — Z0000 Encounter for general adult medical examination without abnormal findings: Secondary | ICD-10-CM

## 2021-09-15 DIAGNOSIS — M1A9XX0 Chronic gout, unspecified, without tophus (tophi): Secondary | ICD-10-CM | POA: Diagnosis not present

## 2021-09-15 DIAGNOSIS — Z1211 Encounter for screening for malignant neoplasm of colon: Secondary | ICD-10-CM | POA: Diagnosis not present

## 2021-09-15 LAB — COMPREHENSIVE METABOLIC PANEL
ALT: 45 U/L (ref 0–53)
AST: 31 U/L (ref 0–37)
Albumin: 4.6 g/dL (ref 3.5–5.2)
Alkaline Phosphatase: 64 U/L (ref 39–117)
BUN: 14 mg/dL (ref 6–23)
CO2: 28 mEq/L (ref 19–32)
Calcium: 9 mg/dL (ref 8.4–10.5)
Chloride: 101 mEq/L (ref 96–112)
Creatinine, Ser: 0.94 mg/dL (ref 0.40–1.50)
GFR: 92.3 mL/min (ref >60.00)
Glucose, Bld: 85 mg/dL (ref 70–99)
Potassium: 4.2 mEq/L (ref 3.5–5.1)
Sodium: 138 mEq/L (ref 135–145)
Total Bilirubin: 0.5 mg/dL (ref 0.2–1.2)
Total Protein: 7.1 g/dL (ref 6.0–8.3)

## 2021-09-15 LAB — CBC
HCT: 41.2 % (ref 39.0–52.0)
Hemoglobin: 14.2 g/dL (ref 13.0–17.0)
MCHC: 34.5 g/dL (ref 30.0–36.0)
MCV: 101.8 fl — ABNORMAL HIGH (ref 78.0–100.0)
Platelets: 160 10*3/uL (ref 150.0–400.0)
RBC: 4.04 Mil/uL — ABNORMAL LOW (ref 4.22–5.81)
RDW: 13.5 % (ref 11.5–15.5)
WBC: 3.8 10*3/uL — ABNORMAL LOW (ref 4.0–10.5)

## 2021-09-15 LAB — LIPID PANEL
Cholesterol: 193 mg/dL (ref 0–200)
HDL: 65.9 mg/dL (ref >39.00)
LDL Cholesterol: 87 mg/dL (ref 0–99)
NonHDL: 126.8
Total CHOL/HDL Ratio: 3
Triglycerides: 198 mg/dL — ABNORMAL HIGH (ref 0.0–149.0)
VLDL: 39.6 mg/dL (ref 0.0–40.0)

## 2021-09-15 LAB — URIC ACID: Uric Acid, Serum: 10.8 mg/dL — ABNORMAL HIGH (ref 4.0–7.8)

## 2021-09-15 LAB — PSA: PSA: 2.25 ng/mL (ref 0.10–4.00)

## 2021-09-15 NOTE — Patient Instructions (Addendum)
Give us 2-3 business days to get the results of your labs back.   Keep the diet clean and stay active.  Please get me a copy of your advanced directive form at your convenience.   If you do not hear anything about your referral in the next 1-2 weeks, call our office and ask for an update.  The Shingrix vaccine (for shingles) is a 2 shot series spaced 2-6 months apart. It can make people feel low energy, achy and almost like they have the flu for 48 hours after injection. 1/5 people can have nausea and/or vomiting. Please plan accordingly when deciding on when to get this shot. Call our office for a nurse visit appointment to get this. The second shot of the series is less severe regarding the side effects, but it still lasts 48 hours.   Let us know if you need anything.  

## 2021-09-15 NOTE — Progress Notes (Signed)
Chief Complaint  Patient presents with   Annual Exam   Dizziness    Well Male Shawn Rowland is here for a complete physical.   His last physical was >1 year ago.  Current diet: in general, a "healthy" diet.  Current exercise: body weight exercises Weight trend: stable Fatigue out of ordinary? No. Seat belt? Yes.   Advanced directive? No  Health maintenance Shingrix- No Colonoscopy- No Tetanus- Yes HIV- Yes Hep C- No   Past Medical History:  Diagnosis Date   GERD (gastroesophageal reflux disease)       Past Surgical History:  Procedure Laterality Date   NO PAST SURGERIES      Medications  Current Outpatient Medications on File Prior to Visit  Medication Sig Dispense Refill   allopurinol (ZYLOPRIM) 300 MG tablet TAKE 1/2 TABLET BY MOUTH DAILY 30 tablet 1   colchicine 0.6 MG tablet TAKE 2 TABS AT BEGINNING OF FLARE, 1 TAB 1 HR LATER,1 TAB DAILY UNTIL FLARE RESOLVES 30 tablet 0   indomethacin (INDOCIN) 50 MG capsule TAKE 1 CAPSULE (50 MG TOTAL) BY MOUTH 2 (TWO) TIMES DAILY WITH A MEAL. 60 capsule 0   meloxicam (MOBIC) 7.5 MG tablet Take 1 tablet (7.5 mg total) by mouth 2 (two) times daily as needed for pain. 30 tablet 2   Allergies No Known Allergies  Family History Family History  Problem Relation Age of Onset   Gout Father    Cancer Neg Hx     Review of Systems: Constitutional:  no fevers Eye:  no recent significant change in vision Ear/Nose/Mouth/Throat:  Ears:  no hearing loss Nose/Mouth/Throat:  no complaints of nasal congestion, no sore throat Cardiovascular:  no chest pain Respiratory:  no shortness of breath Gastrointestinal:  no change in bowel habits GU:  Male: negative for dysuria, frequency Musculoskeletal/Extremities:  no joint pain Integumentary (Skin/Breast):  no abnormal skin lesions reported Neurologic:  no headaches Endocrine: No unexpected weight changes Hematologic/Lymphatic:  no abnormal bleeding  Exam BP 122/80   Pulse 76    Temp 98.3 F (36.8 C) (Oral)   Ht 5\' 7"  (1.702 m)   Wt 139 lb 4 oz (63.2 kg)   SpO2 99%   BMI 21.81 kg/m  General:  well developed, well nourished, in no apparent distress Skin:  no significant moles, warts, or growths Head:  no masses, lesions, or tenderness Eyes:  pupils equal and round, sclera anicteric without injection Ears:  canals without lesions, TMs shiny without retraction, no obvious effusion, no erythema Nose:  nares patent, septum midline, mucosa normal Throat/Pharynx:  lips and gingiva without lesion; tongue and uvula midline; non-inflamed pharynx; no exudates or postnasal drainage Neck: neck supple without adenopathy, thyromegaly, or masses Cardiac: RRR, no bruits, no LE edema Lungs:  clear to auscultation, breath sounds equal bilaterally, no respiratory distress Abdomen: BS+, soft, non-tender, non-distended, no masses or organomegaly noted Rectal: Deferred Musculoskeletal:  symmetrical muscle groups noted without atrophy or deformity Neuro:  gait normal; deep tendon reflexes normal and symmetric Psych: well oriented with normal range of affect and appropriate judgment/insight  Assessment and Plan  Well adult exam - Plan: CBC, Comprehensive metabolic panel, Lipid panel  Screen for colon cancer - Plan: Ambulatory referral to Gastroenterology  Encounter for hepatitis C screening test for low risk patient - Plan: Hepatitis C antibody  Chronic gout involving toe of right foot without tophus, unspecified cause - Plan: Uric acid  Screening for prostate cancer - Plan: PSA   Well 56 y.o. male.  Counseled on diet and exercise. Counseled on risks and benefits of prostate cancer screening with PSA. The patient agrees to undergo testing. Advanced directive form provided today.  Immunizations, labs, and further orders as above. Follow up in 6 mo or prn. The patient voiced understanding and agreement to the plan.  Jilda Roche [place], DO 09/15/21 10:23 AM

## 2021-09-16 LAB — HEPATITIS C ANTIBODY
Hepatitis C Ab: NONREACTIVE
SIGNAL TO CUT-OFF: 0.1 (ref <1.00)

## 2021-09-24 ENCOUNTER — Telehealth: Payer: Self-pay | Admitting: Family Medicine

## 2021-09-24 DIAGNOSIS — M1A9XX0 Chronic gout, unspecified, without tophus (tophi): Secondary | ICD-10-CM

## 2021-09-24 MED ORDER — ALLOPURINOL 300 MG PO TABS: 150.0000 mg | ORAL_TABLET | Freq: Every day | ORAL | 1 refills | Status: DC

## 2021-09-24 NOTE — Telephone Encounter (Signed)
Sent in

## 2021-09-24 NOTE — Telephone Encounter (Signed)
Medication: allopurinol (ZYLOPRIM) 300 MG tablet  Has the patient contacted their pharmacy? Yes.    Preferred Pharmacy (with phone number or street name):  [id number] [place], [place] - [address]  [address] [place] [address]  Phone:  [phone]  Fax:  [phone]   Agent: Please be advised that RX refills may take up to 3 business days. We ask that you follow-up with your pharmacy.

## 2021-11-01 LAB — COLOGUARD: Cologuard: NEGATIVE

## 2021-11-02 ENCOUNTER — Encounter: Payer: Self-pay | Admitting: Family Medicine

## 2021-11-02 ENCOUNTER — Ambulatory Visit (INDEPENDENT_AMBULATORY_CARE_PROVIDER_SITE_OTHER): Payer: BC Managed Care – PPO | Admitting: Family Medicine

## 2021-11-02 VITALS — BP 128/84 | HR 74 | Temp 98.2°F | Ht 67.0 in | Wt 141.2 lb

## 2021-11-02 DIAGNOSIS — M1A9XX0 Chronic gout, unspecified, without tophus (tophi): Secondary | ICD-10-CM

## 2021-11-02 DIAGNOSIS — Z1211 Encounter for screening for malignant neoplasm of colon: Secondary | ICD-10-CM | POA: Diagnosis not present

## 2021-11-02 LAB — URIC ACID: Uric Acid, Serum: 5.3 mg/dL (ref 4.0–7.8)

## 2021-11-02 MED ORDER — COLCHICINE 0.6 MG PO TABS: ORAL_TABLET | ORAL | 0 refills | Status: DC

## 2021-11-02 NOTE — Patient Instructions (Addendum)
Give [place] 2-3 business days to get the results of your labs back.   Reflux: consider taking Pepcid (famotidine) 20 mg 1-2 times daily as needed for reflux.   Foods to AVOID: Red meat, organ meat (liver), lunch meat, seafood (mussels, scallops, anchovies, etc) Alcohol Sugary foods/beverages (diet soft drinks have no link to flares)  Foods to migrate to: Dairy Vegetables Cherries have limited data to suggest they help lower uric acid levels (and prevent flares) Vit C (500 mg daily) may have a modest effect with preventing flares Poultry If you are going to eat red meat, beef and pork may give you less problems than lamb.  Let us know if you need anything.

## 2021-11-02 NOTE — Progress Notes (Signed)
Chief Complaint  Patient presents with   Follow-up    Shawn Rowland is a 55 y.o. male here for gout.  Currently being treated with allopurinol 150 mg/d. 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  Reports compliance. Side effects of medications: None Is avoiding seafood, sweet/sugary beverages, alcohol, and red meats.  Past Medical History:  Diagnosis Date   GERD (gastroesophageal reflux disease)     BP 128/84   Pulse 74   Temp 98.2 F (36.8 C) (Oral)   Ht 5\' 7"  (1.702 m)   Wt 141 lb 4 oz (64.1 kg)   SpO2 99%   BMI 22.12 kg/m  Gen: Awake, alert, appears stated age Neck: No masses or asymmetry Heart: RRR, no murmurs, no bruits, no LE edema Lungs: CTAB, no accessory muscle use MSK: no swelling or TTP; large tophus over L lat mall without ttp Skin: No erythema or warmth Psych: Age appropriate judgment and insight, nml mood and affect  Chronic gout involving toe of right foot without tophus, unspecified cause - Plan: colchicine 0.6 MG tablet, Uric acid  Colon cancer screening - Plan: Cologuard  Chronic, uncontrolled. Cont allopurinol for now, will tx to at least <6.8. Reminded to avoid foods like alcohol, sweet beverages, red meat, lunch meat, sea food. 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  Declines colonoscopy at this time. OK to do Cologard. F/u in 6 mo or prn. The patient voiced understanding and agreement to the plan.  [place], DO 11/02/21 9:07 AM

## 2021-11-13 DIAGNOSIS — Z1211 Encounter for screening for malignant neoplasm of colon: Secondary | ICD-10-CM | POA: Diagnosis not present

## 2021-11-20 LAB — COLOGUARD
COLOGUARD: NEGATIVE
Cologuard: NEGATIVE

## 2021-12-23 ENCOUNTER — Other Ambulatory Visit: Payer: Self-pay | Admitting: Family Medicine

## 2021-12-23 DIAGNOSIS — M1A9XX0 Chronic gout, unspecified, without tophus (tophi): Secondary | ICD-10-CM

## 2022-01-17 ENCOUNTER — Other Ambulatory Visit: Payer: Self-pay | Admitting: Family Medicine

## 2022-01-17 DIAGNOSIS — M1A9XX0 Chronic gout, unspecified, without tophus (tophi): Secondary | ICD-10-CM

## 2022-08-04 ENCOUNTER — Other Ambulatory Visit: Payer: Self-pay | Admitting: Family Medicine

## 2022-08-04 ENCOUNTER — Other Ambulatory Visit: Payer: Self-pay | Admitting: Physician Assistant

## 2022-08-04 DIAGNOSIS — M1A9XX0 Chronic gout, unspecified, without tophus (tophi): Secondary | ICD-10-CM

## 2022-09-27 ENCOUNTER — Encounter: Payer: Self-pay | Admitting: Family Medicine

## 2022-09-27 ENCOUNTER — Other Ambulatory Visit: Payer: Self-pay | Admitting: Family Medicine

## 2022-09-27 ENCOUNTER — Ambulatory Visit (INDEPENDENT_AMBULATORY_CARE_PROVIDER_SITE_OTHER): Payer: BC Managed Care – PPO | Admitting: Family Medicine

## 2022-09-27 VITALS — BP 134/82 | HR 94 | Temp 98.2°F | Ht 66.0 in | Wt 138.2 lb

## 2022-09-27 DIAGNOSIS — M1A9XX0 Chronic gout, unspecified, without tophus (tophi): Secondary | ICD-10-CM

## 2022-09-27 DIAGNOSIS — Z0000 Encounter for general adult medical examination without abnormal findings: Secondary | ICD-10-CM | POA: Diagnosis not present

## 2022-09-27 DIAGNOSIS — Z125 Encounter for screening for malignant neoplasm of prostate: Secondary | ICD-10-CM | POA: Diagnosis not present

## 2022-09-27 DIAGNOSIS — E785 Hyperlipidemia, unspecified: Secondary | ICD-10-CM

## 2022-09-27 LAB — COMPREHENSIVE METABOLIC PANEL
ALT: 50 U/L (ref 0–53)
AST: 45 U/L — ABNORMAL HIGH (ref 0–37)
Albumin: 4.6 g/dL (ref 3.5–5.2)
Alkaline Phosphatase: 65 U/L (ref 39–117)
BUN: 14 mg/dL (ref 6–23)
CO2: 27 mEq/L (ref 19–32)
Calcium: 9.4 mg/dL (ref 8.4–10.5)
Chloride: 100 mEq/L (ref 96–112)
Creatinine, Ser: 1.09 mg/dL (ref 0.40–1.50)
GFR: 76.72 mL/min (ref >60.00)
Glucose, Bld: 95 mg/dL (ref 70–99)
Potassium: 4.1 mEq/L (ref 3.5–5.1)
Sodium: 141 mEq/L (ref 135–145)
Total Bilirubin: 0.8 mg/dL (ref 0.2–1.2)
Total Protein: 7.4 g/dL (ref 6.0–8.3)

## 2022-09-27 LAB — CBC
HCT: 43.9 % (ref 39.0–52.0)
Hemoglobin: 14.9 g/dL (ref 13.0–17.0)
MCHC: 33.9 g/dL (ref 30.0–36.0)
MCV: 104.5 fl — ABNORMAL HIGH (ref 78.0–100.0)
Platelets: 214 10*3/uL (ref 150.0–400.0)
RBC: 4.2 Mil/uL — ABNORMAL LOW (ref 4.22–5.81)
RDW: 14 % (ref 11.5–15.5)
WBC: 5.8 10*3/uL (ref 4.0–10.5)

## 2022-09-27 LAB — PSA: PSA: 2.34 ng/mL (ref 0.10–4.00)

## 2022-09-27 LAB — LIPID PANEL
Cholesterol: 231 mg/dL — ABNORMAL HIGH (ref 0–200)
HDL: 59.1 mg/dL (ref >39.00)
Total CHOL/HDL Ratio: 4
Triglycerides: 622 mg/dL — ABNORMAL HIGH (ref 0.0–149.0)

## 2022-09-27 LAB — LDL CHOLESTEROL, DIRECT: Direct LDL: 101 mg/dL

## 2022-09-27 LAB — URIC ACID: Uric Acid, Serum: 11.8 mg/dL — ABNORMAL HIGH (ref 4.0–7.8)

## 2022-09-27 NOTE — Progress Notes (Signed)
Chief Complaint  Patient presents with   Annual Exam    Well Male Shawn Rowland is here for a complete physical.   His last physical was >1 year ago.  Current diet: in general, a "healthy" diet.  Current exercise: walking Weight trend: stable Fatigue out of ordinary? No. Seat belt? Yes.   Advanced directive? No  Health maintenance Shingrix- No Colonoscopy- Yes Tetanus- Yes HIV- Yes Hep C- Yes   Past Medical History:  Diagnosis Date   GERD (gastroesophageal reflux disease)     Past Surgical History:  Procedure Laterality Date   NO PAST SURGERIES     Medications  Current Outpatient Medications on File Prior to Visit  Medication Sig Dispense Refill   allopurinol (ZYLOPRIM) 300 MG tablet TAKE 0.5 TABLETS BY MOUTH DAILY. 15 tablet 3   colchicine 0.6 MG tablet TAKE 2 TABS AND REPEAT IN 1 HR. THEN TAKE DAILY UNTIL GOUT FLARE RESOLVES. 30 tablet 0   Allergies No Known Allergies  Family History Family History  Problem Relation Age of Onset   Gout Father    Cancer Neg Hx     Review of Systems: Constitutional:  no fevers Eye:  no recent significant change in vision Ear/Nose/Mouth/Throat:  Ears:  no hearing loss Nose/Mouth/Throat:  no complaints of nasal congestion, no sore throat Cardiovascular:  no chest pain Respiratory:  no shortness of breath Gastrointestinal:  no change in bowel habits GU:  Male: negative for dysuria, frequency Musculoskeletal/Extremities:  no joint pain Integumentary (Skin/Breast):  no abnormal skin lesions reported Neurologic:  no headaches Endocrine: No unexpected weight changes Hematologic/Lymphatic:  no abnormal bleeding  Exam BP 134/82 (BP Location: Left Arm, Patient Position: Sitting, Cuff Size: Normal)   Pulse 94   Temp 98.2 F (36.8 C) (Oral)   Ht 5\' 6"  (1.676 m)   Wt 138 lb 4 oz (62.7 kg)   SpO2 98%   BMI 22.31 kg/m  General:  well developed, well nourished, in no apparent distress Skin:  no significant moles, warts, or  growths Head:  no masses, lesions, or tenderness Eyes:  pupils equal and round, sclera anicteric without injection Ears:  canals without lesions, TMs shiny without retraction, no obvious effusion, no erythema Nose:  nares patent, mucosa normal Throat/Pharynx:  lips and gingiva without lesion; tongue and uvula midline; non-inflamed pharynx; no exudates or postnasal drainage Neck: neck supple without adenopathy, thyromegaly, or masses Cardiac: RRR, no bruits, no LE edema Lungs:  clear to auscultation, breath sounds equal bilaterally, no respiratory distress Abdomen: BS+, soft, non-tender, non-distended, no masses or organomegaly noted Rectal: Deferred Musculoskeletal:  symmetrical muscle groups noted without atrophy or deformity Neuro:  gait normal; deep tendon reflexes brisk and symmetric Psych: well oriented with normal range of affect and appropriate judgment/insight  Assessment and Plan  Well adult exam - Plan: CBC, Comprehensive metabolic panel, Lipid panel  Chronic gout involving toe of right foot without tophus, unspecified cause - Plan: Uric acid  Screening for prostate cancer - Plan: PSA   Well 57 y.o. male. Counseled on diet and exercise. Counseled on risks and benefits of prostate cancer screening with PSA. The patient agrees to undergo testing. Advanced directive form provided today.  Immunizations, labs, and further orders as above. Follow up in 6 mo. The patient voiced understanding and agreement to the plan.  Jilda Roche [place], DO 09/27/22 8:45 AM

## 2022-09-27 NOTE — Addendum Note (Signed)
Addended by: Scharlene Gloss B on: 09/27/2022 03:53 PM   Modules accepted: Orders

## 2022-09-27 NOTE — Patient Instructions (Signed)
Give us 2-3 business days to get the results of your labs back.   Keep the diet clean and stay active.  Please get me a copy of your advanced directive form at your convenience.   Let us know if you need anything.  

## 2022-10-20 ENCOUNTER — Other Ambulatory Visit: Payer: Self-pay | Admitting: Family Medicine

## 2022-10-20 DIAGNOSIS — M1A9XX0 Chronic gout, unspecified, without tophus (tophi): Secondary | ICD-10-CM

## 2022-11-01 ENCOUNTER — Other Ambulatory Visit (INDEPENDENT_AMBULATORY_CARE_PROVIDER_SITE_OTHER): Payer: BC Managed Care – PPO

## 2022-11-01 DIAGNOSIS — M1A9XX0 Chronic gout, unspecified, without tophus (tophi): Secondary | ICD-10-CM | POA: Diagnosis not present

## 2022-11-01 DIAGNOSIS — E785 Hyperlipidemia, unspecified: Secondary | ICD-10-CM | POA: Diagnosis not present

## 2022-11-01 LAB — HEPATIC FUNCTION PANEL
ALT: 48 U/L (ref 0–53)
AST: 44 U/L — ABNORMAL HIGH (ref 0–37)
Albumin: 4.3 g/dL (ref 3.5–5.2)
Alkaline Phosphatase: 62 U/L (ref 39–117)
Bilirubin, Direct: 0.1 mg/dL (ref 0.0–0.3)
Total Bilirubin: 0.5 mg/dL (ref 0.2–1.2)
Total Protein: 6.8 g/dL (ref 6.0–8.3)

## 2022-11-01 LAB — LIPID PANEL
Cholesterol: 220 mg/dL — ABNORMAL HIGH (ref 0–200)
HDL: 45.3 mg/dL (ref >39.00)
Total CHOL/HDL Ratio: 5
Triglycerides: 664 mg/dL — ABNORMAL HIGH (ref 0.0–149.0)

## 2022-11-01 LAB — LDL CHOLESTEROL, DIRECT: Direct LDL: 88 mg/dL

## 2022-11-01 LAB — URIC ACID: Uric Acid, Serum: 6.6 mg/dL (ref 4.0–7.8)

## 2022-11-02 ENCOUNTER — Encounter: Payer: Self-pay | Admitting: Family Medicine

## 2022-11-02 ENCOUNTER — Other Ambulatory Visit: Payer: Self-pay | Admitting: Family Medicine

## 2022-11-02 DIAGNOSIS — E785 Hyperlipidemia, unspecified: Secondary | ICD-10-CM

## 2022-11-02 LAB — COLOGUARD: Cologuard: NEGATIVE

## 2022-11-02 MED ORDER — ROSUVASTATIN CALCIUM 20 MG PO TABS: 20.0000 mg | ORAL_TABLET | Freq: Every day | ORAL | 3 refills | Status: DC

## 2022-12-14 ENCOUNTER — Other Ambulatory Visit: Payer: Self-pay | Admitting: Family Medicine

## 2022-12-14 DIAGNOSIS — M1A9XX0 Chronic gout, unspecified, without tophus (tophi): Secondary | ICD-10-CM

## 2022-12-17 ENCOUNTER — Other Ambulatory Visit (INDEPENDENT_AMBULATORY_CARE_PROVIDER_SITE_OTHER): Payer: BC Managed Care – PPO

## 2022-12-17 DIAGNOSIS — E785 Hyperlipidemia, unspecified: Secondary | ICD-10-CM | POA: Diagnosis not present

## 2022-12-17 LAB — LIPID PANEL
Cholesterol: 205 mg/dL — ABNORMAL HIGH (ref 0–200)
HDL: 46.7 mg/dL (ref >39.00)
LDL Cholesterol: 130 mg/dL — ABNORMAL HIGH (ref 0–99)
NonHDL: 158.49
Total CHOL/HDL Ratio: 4
Triglycerides: 141 mg/dL (ref 0.0–149.0)
VLDL: 28.2 mg/dL (ref 0.0–40.0)

## 2023-03-17 ENCOUNTER — Other Ambulatory Visit: Payer: Self-pay | Admitting: Family Medicine

## 2023-03-17 DIAGNOSIS — M1A9XX0 Chronic gout, unspecified, without tophus (tophi): Secondary | ICD-10-CM

## 2023-05-22 ENCOUNTER — Other Ambulatory Visit: Payer: Self-pay | Admitting: Family Medicine

## 2023-05-22 DIAGNOSIS — M1A9XX0 Chronic gout, unspecified, without tophus (tophi): Secondary | ICD-10-CM

## 2023-06-20 ENCOUNTER — Ambulatory Visit: Payer: BC Managed Care – PPO | Admitting: Family Medicine

## 2023-06-24 ENCOUNTER — Other Ambulatory Visit: Payer: Self-pay | Admitting: Family Medicine

## 2023-06-24 DIAGNOSIS — M1A9XX0 Chronic gout, unspecified, without tophus (tophi): Secondary | ICD-10-CM

## 2023-06-24 NOTE — Telephone Encounter (Signed)
 Requesting: COLCHICINE 0.6 MG TABLET  Last Visit: 09/27/2022 Next Visit: Visit date not found Last Refill: 05/23/2023  Please Advise

## 2023-09-28 ENCOUNTER — Other Ambulatory Visit: Payer: Self-pay | Admitting: Family Medicine

## 2023-09-28 ENCOUNTER — Ambulatory Visit: Payer: Self-pay | Admitting: Family Medicine

## 2023-09-28 ENCOUNTER — Encounter: Payer: Self-pay | Admitting: Family Medicine

## 2023-09-28 ENCOUNTER — Ambulatory Visit (INDEPENDENT_AMBULATORY_CARE_PROVIDER_SITE_OTHER): Admitting: Family Medicine

## 2023-09-28 VITALS — BP 128/80 | HR 95 | Temp 98.0°F | Resp 16 | Ht 66.0 in | Wt 143.0 lb

## 2023-09-28 DIAGNOSIS — Z0000 Encounter for general adult medical examination without abnormal findings: Secondary | ICD-10-CM | POA: Diagnosis not present

## 2023-09-28 DIAGNOSIS — M1A9XX0 Chronic gout, unspecified, without tophus (tophi): Secondary | ICD-10-CM

## 2023-09-28 DIAGNOSIS — Z125 Encounter for screening for malignant neoplasm of prostate: Secondary | ICD-10-CM

## 2023-09-28 DIAGNOSIS — R7989 Other specified abnormal findings of blood chemistry: Secondary | ICD-10-CM

## 2023-09-28 LAB — COMPREHENSIVE METABOLIC PANEL WITH GFR
ALT: 128 U/L — ABNORMAL HIGH (ref 0–53)
AST: 83 U/L — ABNORMAL HIGH (ref 0–37)
Albumin: 4.8 g/dL (ref 3.5–5.2)
Alkaline Phosphatase: 73 U/L (ref 39–117)
BUN: 9 mg/dL (ref 6–23)
CO2: 27 meq/L (ref 19–32)
Calcium: 9.2 mg/dL (ref 8.4–10.5)
Chloride: 100 meq/L (ref 96–112)
Creatinine, Ser: 0.86 mg/dL (ref 0.40–1.50)
GFR: 97.19 mL/min (ref >60.00)
Glucose, Bld: 103 mg/dL — ABNORMAL HIGH (ref 70–99)
Potassium: 4.3 meq/L (ref 3.5–5.1)
Sodium: 137 meq/L (ref 135–145)
Total Bilirubin: 0.7 mg/dL (ref 0.2–1.2)
Total Protein: 7.5 g/dL (ref 6.0–8.3)

## 2023-09-28 LAB — LIPID PANEL
Cholesterol: 257 mg/dL — ABNORMAL HIGH (ref 0–200)
HDL: 75.4 mg/dL (ref >39.00)
LDL Cholesterol: 151 mg/dL — ABNORMAL HIGH (ref 0–99)
NonHDL: 181.35
Total CHOL/HDL Ratio: 3
Triglycerides: 151 mg/dL — ABNORMAL HIGH (ref 0.0–149.0)
VLDL: 30.2 mg/dL (ref 0.0–40.0)

## 2023-09-28 LAB — PSA: PSA: 2.4 ng/mL (ref 0.10–4.00)

## 2023-09-28 LAB — CBC
HCT: 43.6 % (ref 39.0–52.0)
Hemoglobin: 14.9 g/dL (ref 13.0–17.0)
MCHC: 34.1 g/dL (ref 30.0–36.0)
MCV: 102.9 fl — ABNORMAL HIGH (ref 78.0–100.0)
Platelets: 202 10*3/uL (ref 150.0–400.0)
RBC: 4.24 Mil/uL (ref 4.22–5.81)
RDW: 13.7 % (ref 11.5–15.5)
WBC: 4.2 10*3/uL (ref 4.0–10.5)

## 2023-09-28 LAB — URIC ACID: Uric Acid, Serum: 6.1 mg/dL (ref 4.0–7.8)

## 2023-09-28 NOTE — Patient Instructions (Addendum)
 Give us  2-3 business days to get the results of your labs back.   Keep the diet clean and stay active.  Please consider adding some weight resistance exercise to your routine. Consider yoga as well.   Please get me a copy of your advanced directive form at your convenience.   The only lifestyle changes that have data behind them are weight loss for the overweight/obese and elevating the head of the bed. Finding out which foods/positions are triggers is important.  Pepcid /famotidine  20 mg 1-2 times daily can help with reflux symptoms.   The Shingrix vaccine (for shingles) is a 2 shot series spaced 2-6 months apart. It can make people feel low energy, achy and almost like they have the flu for 48 hours after injection. 1/5 people can have nausea and/or vomiting. Please plan accordingly when deciding on when to get this shot. Call our office for a nurse visit appointment to get this. The second shot of the series is less severe regarding the side effects, but it still lasts 48 hours.   Let us  know if you need anything.

## 2023-09-28 NOTE — Progress Notes (Signed)
 Chief Complaint  Patient presents with   Annual Exam    CPE    Well Male Shawn Rowland is here for a complete physical.   His last physical was >1 year ago.  Current diet: in general, a "healthy" diet.  Current exercise: walking Weight trend: stable Fatigue out of ordinary? No. Seat belt? Yes.   Advanced directive? No  Health maintenance Shingrix- No Colonoscopy- Yes Tetanus- Yes HIV- Yes Hep C- Yes   Past Medical History:  Diagnosis Date   GERD (gastroesophageal reflux disease)       Past Surgical History:  Procedure Laterality Date   NO PAST SURGERIES      Medications  Current Outpatient Medications on File Prior to Visit  Medication Sig Dispense Refill   allopurinol  (ZYLOPRIM ) 300 MG tablet TAKE 1/2 TABLET BY MOUTH DAILY 15 tablet 3   colchicine  0.6 MG tablet TAKE 2 TABLETS TO START, THEN 1 TABLET IN 1 HOUR. THEN TAKE ONCE DAILY UNTIL GOUT FLARE RESOLVES 30 tablet 0   rosuvastatin  (CRESTOR ) 20 MG tablet Take 1 tablet (20 mg total) by mouth daily. 90 tablet 3    Allergies No Known Allergies  Family History Family History  Problem Relation Age of Onset   Gout Father    Cancer Neg Hx     Review of Systems: Constitutional:  no fevers Eye:  no recent significant change in vision Ear/Nose/Mouth/Throat:  Ears:  no hearing loss Nose/Mouth/Throat:  no complaints of nasal congestion, no sore throat Cardiovascular:  no chest pain Respiratory:  no shortness of breath Gastrointestinal: +reflux; no dysphagia, bleeding, change in bowel habits GU:  Male: negative for dysuria, frequency Musculoskeletal/Extremities:  no joint pain Integumentary (Skin/Breast):  no abnormal skin lesions reported Neurologic:  no headaches Endocrine: No unexpected weight changes Hematologic/Lymphatic:  no abnormal bleeding  Exam BP 128/80 (BP Location: Left Arm, Patient Position: Sitting)   Pulse 95   Temp 98 F (36.7 C) (Oral)   Resp 16   Ht 5\' 6"  (1.676 m)   Wt 143 lb (64.9  kg)   SpO2 96%   BMI 23.08 kg/m  General:  well developed, well nourished, in no apparent distress Skin:  no significant moles, warts, or growths Head:  no masses, lesions, or tenderness Eyes:  pupils equal and round, sclera anicteric without injection Ears:  canals without lesions, TMs shiny without retraction, no obvious effusion, no erythema Nose:  nares patent, mucosa normal Throat/Pharynx:  lips and gingiva without lesion; tongue and uvula midline; non-inflamed pharynx; no exudates or postnasal drainage Neck: neck supple without adenopathy, thyromegaly, or masses Cardiac: RRR, no bruits, no LE edema Lungs:  clear to auscultation, breath sounds equal bilaterally, no respiratory distress Abdomen: BS+, soft, non-tender, non-distended, no masses or organomegaly noted Rectal: Deferred Musculoskeletal:  symmetrical muscle groups noted without atrophy or deformity Neuro:  gait normal; deep tendon reflexes normal and symmetric Psych: well oriented with normal range of affect and appropriate judgment/insight  Assessment and Plan  Well adult exam - Plan: CBC, Comprehensive metabolic panel with GFR, Lipid panel  Chronic gout involving toe of right foot without tophus, unspecified cause - Plan: PSA  Screening for prostate cancer - Plan: Uric acid   Well 58 y.o. male. Counseled on diet and exercise. Counseled on risks and benefits of prostate cancer screening with PSA. The patient agrees to undergo testing. Advanced directive form provided today.  Some reflux issues. No red flag s/s's. Pepcid  and lifestyle mods rec'd.  Immunizations, labs, and further orders  as above. Follow up in 6 mo. The patient voiced understanding and agreement to the plan.  Shellie Dials [place], DO 09/28/23 7:26 AM

## 2023-09-28 NOTE — Addendum Note (Signed)
 Addended by: Creed Dodrill on: 09/28/2023 12:48 PM   Modules accepted: Level of Service

## 2023-09-30 ENCOUNTER — Other Ambulatory Visit: Payer: Self-pay

## 2023-09-30 DIAGNOSIS — E7800 Pure hypercholesterolemia, unspecified: Secondary | ICD-10-CM

## 2023-10-03 ENCOUNTER — Telehealth: Payer: Self-pay

## 2023-10-03 MED ORDER — ROSUVASTATIN CALCIUM 20 MG PO TABS: 20.0000 mg | ORAL_TABLET | Freq: Every day | ORAL | 3 refills | Status: AC

## 2023-10-03 NOTE — Telephone Encounter (Signed)
 Called pt lab appt schedule and US  appt.

## 2023-10-03 NOTE — Telephone Encounter (Signed)
 Copied from CRM [id number]. Topic: Clinical - Medication Question >> Sep 30, 2023  5:07 PM Shawn Rowland wrote: Reason for CRM: Patient has a question regarding their medication. Patient would like one of the nurses to give him a call back on Monday when the office is open.

## 2023-10-04 ENCOUNTER — Ambulatory Visit: Payer: Self-pay | Admitting: Family Medicine

## 2023-10-04 ENCOUNTER — Ambulatory Visit (HOSPITAL_BASED_OUTPATIENT_CLINIC_OR_DEPARTMENT_OTHER)
Admission: RE | Admit: 2023-10-04 | Discharge: 2023-10-04 | Disposition: A | Discharge status: Home / Self Care | Source: Ambulatory Visit | Attending: Family Medicine | Admitting: Family Medicine

## 2023-10-04 DIAGNOSIS — K8020 Calculus of gallbladder without cholecystitis without obstruction: Secondary | ICD-10-CM | POA: Diagnosis not present

## 2023-10-04 DIAGNOSIS — R7989 Other specified abnormal findings of blood chemistry: Secondary | ICD-10-CM | POA: Insufficient documentation

## 2023-10-04 DIAGNOSIS — K760 Fatty (change of) liver, not elsewhere classified: Secondary | ICD-10-CM | POA: Diagnosis not present

## 2023-10-09 ENCOUNTER — Other Ambulatory Visit: Payer: Self-pay | Admitting: Family Medicine

## 2023-10-09 DIAGNOSIS — M1A9XX0 Chronic gout, unspecified, without tophus (tophi): Secondary | ICD-10-CM

## 2023-11-01 ENCOUNTER — Other Ambulatory Visit

## 2023-11-21 ENCOUNTER — Ambulatory Visit: Payer: Self-pay | Admitting: Family Medicine

## 2023-11-21 ENCOUNTER — Other Ambulatory Visit (INDEPENDENT_AMBULATORY_CARE_PROVIDER_SITE_OTHER)

## 2023-11-21 DIAGNOSIS — E7800 Pure hypercholesterolemia, unspecified: Secondary | ICD-10-CM | POA: Diagnosis not present

## 2023-11-21 LAB — HEPATIC FUNCTION PANEL
ALT: 35 U/L (ref 0–53)
AST: 42 U/L — ABNORMAL HIGH (ref 0–37)
Albumin: 4.4 g/dL (ref 3.5–5.2)
Alkaline Phosphatase: 65 U/L (ref 39–117)
Bilirubin, Direct: 0.1 mg/dL (ref 0.0–0.3)
Total Bilirubin: 0.4 mg/dL (ref 0.2–1.2)
Total Protein: 6.6 g/dL (ref 6.0–8.3)

## 2023-11-21 LAB — LIPID PANEL
Cholesterol: 137 mg/dL (ref 0–200)
HDL: 68.7 mg/dL (ref >39.00)
LDL Cholesterol: 54 mg/dL (ref 0–99)
NonHDL: 68.32
Total CHOL/HDL Ratio: 2
Triglycerides: 73 mg/dL (ref 0.0–149.0)
VLDL: 14.6 mg/dL (ref 0.0–40.0)

## 2024-02-09 ENCOUNTER — Other Ambulatory Visit: Payer: Self-pay | Admitting: Family Medicine

## 2024-02-09 DIAGNOSIS — M1A9XX0 Chronic gout, unspecified, without tophus (tophi): Secondary | ICD-10-CM

## 2024-04-18 ENCOUNTER — Other Ambulatory Visit: Payer: Self-pay | Admitting: Family Medicine

## 2024-04-18 DIAGNOSIS — M1A9XX0 Chronic gout, unspecified, without tophus (tophi): Secondary | ICD-10-CM
